# Patient Record
Sex: Male | Born: 1955
Health system: Southern US, Community
[De-identification: ages and names within clinical notes are randomized; demographics above are authoritative.]

## PROBLEM LIST (undated history)

## (undated) DIAGNOSIS — I10 Essential (primary) hypertension: Secondary | ICD-10-CM

## (undated) DIAGNOSIS — E785 Hyperlipidemia, unspecified: Secondary | ICD-10-CM

## (undated) DIAGNOSIS — K219 Gastro-esophageal reflux disease without esophagitis: Secondary | ICD-10-CM

## (undated) DIAGNOSIS — Z972 Presence of dental prosthetic device (complete) (partial): Secondary | ICD-10-CM

## (undated) DIAGNOSIS — K589 Irritable bowel syndrome without diarrhea: Secondary | ICD-10-CM

## (undated) HISTORY — DX: Gastro-esophageal reflux disease without esophagitis: K21.9

## (undated) HISTORY — PX: WISDOM TOOTH EXTRACTION: SHX21

## (undated) HISTORY — DX: Essential (primary) hypertension: I10

## (undated) HISTORY — DX: Hyperlipidemia, unspecified: E78.5

## (undated) HISTORY — PX: TONSILLECTOMY: SUR1361

## (undated) HISTORY — PX: VASECTOMY: SHX75

## (undated) HISTORY — DX: Irritable bowel syndrome, unspecified: K58.9

---

## 2007-09-14 ENCOUNTER — Ambulatory Visit: Payer: Self-pay | Admitting: Gastroenterology

## 2007-11-16 ENCOUNTER — Emergency Department: Payer: Self-pay | Admitting: Internal Medicine

## 2008-08-07 ENCOUNTER — Encounter: Payer: Self-pay | Admitting: Cardiovascular Disease

## 2008-12-26 ENCOUNTER — Encounter: Payer: Self-pay | Admitting: Cardiovascular Disease

## 2008-12-26 LAB — CONVERTED CEMR LAB
ALT: 33 units/L
AST: 31 units/L
Albumin: 4.4 g/dL
Alkaline Phosphatase: 43 units/L
BUN: 12 mg/dL
Basophils Relative: 0 %
CO2: 23 meq/L
Calcium: 8.9 mg/dL
Chloride: 101 meq/L
Cholesterol: 230 mg/dL
Cholesterol: 230 mg/dL
Creatinine, Ser: 0.95 mg/dL
Eosinophils Relative: 0.1 %
Glucose, Bld: 93 mg/dL
HCT: 41.1 %
HDL: 85 mg/dL
Hemoglobin: 14.1 g/dL
LDL (calc): 131 mg/dL
Lymphocytes, automated: 38 %
MCV: 100 fL
Monocytes Relative: 0.5 %
Neutrophils Relative %: 40 %
Platelets: 306 10*3/uL
Potassium: 4.7 meq/L
RBC: 4.12 M/uL
RDW: 14.1 %
Sodium: 141 meq/L
TSH: 1.58 microintl units/mL
Total Bilirubin: 0.7 mg/dL
Total Protein: 7.2 g/dL
Triglyceride fasting, serum: 72 mg/dL
WBC: 3.2 10*3/uL

## 2008-12-31 ENCOUNTER — Encounter: Payer: Self-pay | Admitting: Cardiovascular Disease

## 2009-11-11 ENCOUNTER — Telehealth: Payer: Self-pay | Admitting: Cardiovascular Disease

## 2009-12-26 ENCOUNTER — Encounter: Payer: Self-pay | Admitting: Cardiovascular Disease

## 2010-01-06 ENCOUNTER — Ambulatory Visit: Payer: Self-pay | Admitting: Cardiovascular Disease

## 2010-01-06 DIAGNOSIS — E785 Hyperlipidemia, unspecified: Secondary | ICD-10-CM | POA: Insufficient documentation

## 2010-01-06 DIAGNOSIS — I10 Essential (primary) hypertension: Secondary | ICD-10-CM | POA: Insufficient documentation

## 2010-01-07 ENCOUNTER — Encounter: Payer: Self-pay | Admitting: Cardiovascular Disease

## 2010-10-01 ENCOUNTER — Telehealth: Payer: Self-pay | Admitting: Cardiovascular Disease

## 2010-10-09 NOTE — Letter (Signed)
Summary: [facility]  [facility]   Imported By: Harlon Flor 01/15/2010 15:12:16  _____________________________________________________________________  External Attachment:    Type:   Image     Comment:   External Document

## 2010-10-09 NOTE — Letter (Signed)
Summary: [facility]  [facility]   Imported By: Harlon Flor 01/15/2010 15:12:54  _____________________________________________________________________  External Attachment:    Type:   Image     Comment:   External Document

## 2010-10-09 NOTE — Letter (Signed)
Summary: PHI  PHI   Imported By: Harlon Flor 01/13/2010 15:45:36  _____________________________________________________________________  External Attachment:    Type:   Image     Comment:   External Document

## 2010-10-09 NOTE — Progress Notes (Signed)
Summary: RX  Phone Note Refill Request Call back at Home Phone [phone] Message from:  Patient on October 01, 2010 10:24 AM  Refills Requested: Medication #1:  LOSARTAN POTASSIUM-HCTZ 100-25 MG TABS Take 1 tablet by mouth once a day [facility] in [place]  Initial call taken by: Harlon Flor,  October 01, 2010 10:24 AM    Prescriptions: LOSARTAN POTASSIUM-HCTZ 100-25 MG TABS (LOSARTAN POTASSIUM-HCTZ) Take 1 tablet by mouth once a day  #30 x 6   Entered by:   Lysbeth Galas CMA   Authorized by:   Dossie Arbour MD   Signed by:   Lysbeth Galas CMA on 10/01/2010   Method used:   Electronically to        [facility]. [phone]* (retail)       [address]       [place], [place]  [address]       Ph: [phone]       Fax: [phone]   RxID:   [id number]

## 2010-10-09 NOTE — Letter (Signed)
Summary: Medical Record Release  Medical Record Release   Imported By: Harlon Flor 01/07/2010 11:17:02  _____________________________________________________________________  External Attachment:    Type:   Image     Comment:   External Document

## 2010-10-09 NOTE — Progress Notes (Signed)
Summary: Refill  Phone Note Refill Request Call back at Home Phone [phone] Call back at [phone] Message from:  Patient on November 11, 2009 9:36 AM  Patient is former Engineer, water patient and would like to transfer care here to follow Dr. Mariah Milling.  He is out of amilodipine and would like for it to be called into [facility]  Initial call taken by: [place],  November 11, 2009 9:37 AM Caller: Patient Call For: Dr. Mariah Milling    New/Updated Medications: AMLODIPINE BESYLATE 5 MG TABS (AMLODIPINE BESYLATE) Take one tablet by mouth daily Prescriptions: AMLODIPINE BESYLATE 5 MG TABS (AMLODIPINE BESYLATE) Take one tablet by mouth daily  #30 x 3   Entered by:   Charlena Cross, RN, BSN   Authorized by:   Dossie Arbour MD   Signed by:   Charlena Cross, RN, BSN on 11/11/2009   Method used:   Electronically to        [facility]. [phone]* (retail)       [address]       [place], [place]  [address]       Ph: [phone]       Fax: [phone]   RxID:   [id number]

## 2010-10-09 NOTE — Assessment & Plan Note (Signed)
Summary: NP6/AMD   CC:  ROV; No Complaints.  History of Present Illness: Mr. Borthwick is a 55 year old gentleman with past medical history of hypertension, hyperlipidemia, irritable bowel syndrome and prostate issues presents for routine followup. He also has a history of palpitations. he is known to me from [facility], last seen April 2010.  He states that overall he has been doing well. He continues to work out, works in Research officer, political party. His blood pressure is typically been well-controlled though he is interested in changing his medications to a more simpler regimen 2. He denies any chest pain, shortness of breath, lightheadedness. He did cut his Diovan HCT in  half as he states that his pressure was a little bit low.  cholesterol from April 2010 showed total cholesterol 2:30, LDL 131, HDL 85.  He is no significant family history of coronary artery disease. In the past we did give him some samples of bystolic 5 mg for palpitations he states that this is not an issue.  Problems Prior to Update: None  Medications Prior to Update: 1)  Amlodipine Besylate 5 Mg Tabs (Amlodipine Besylate) .... Take One Tablet By Mouth Daily  Current Medications (verified): 1)  Amlodipine Besylate 5 Mg Tabs (Amlodipine Besylate) .... Take One Tablet By Mouth Daily 2)  Diovan Hct 160-12.5 Mg Tabs (Valsartan-Hydrochlorothiazide) .... Take Half  By Mouth Once Daily 3)  Fish Oil 1000 Mg Caps (Omega-3 Fatty Acids) .... Take 3 By Mouth Once Daily 4)  Red Yeast Rice 1200 Mg Caps (Red Yeast Rice Extract) .... Take 2 By Mouth Once Daily 5)  Avodart 0.5 Mg Caps (Dutasteride) .... Take 1 By Mouth Once Daily 6)  Prostavar .... Take 2 By Mouth Once Daily 7)  Vitamin B-12 500 Mcg Subl (Cyanocobalamin) .... Once Daily  Allergies (verified): No Known Drug Allergies  Review of Systems  The patient denies fever, vision loss, decreased hearing, hoarseness, chest pain, syncope, dyspnea on exertion,  peripheral edema, prolonged cough, abdominal pain, incontinence, muscle weakness, depression, and enlarged lymph nodes.    Vital Signs:  Patient profile:   55 year old male Height:      72204 inches Weight:      12 pounds BMI:     0.00 Pulse rate:   59 / minute BP sitting:   120 / 80  (left arm) Cuff size:   regular  Vitals Entered By: Stanton Kidney, EMT-P (Jan 06, 2010 10:03 AM)  Physical Exam  General:  well-appearing middle-aged gentleman in no apparent distress, HEENT exam is benign, oropharynx is clear, neck is supple with no JVP or carotid bruits, heart sounds are regular with S1-S2 and no murmurs appreciated, lungs are clear to auscultation with no wheezes Rales, abdominal exam is benign, no significant lower extremity edema, pulses are equal and symmetrical in his upper and lower extremity, neurological exam is grossly nonfocal, skin is warm and dry.    EKG  Procedure date:  01/06/2010  Findings:      normal sinus rhythm with rate 59 beats per minute, no significant ST or T wave changes.  Impression & Recommendations:  Problem # 1:  HYPERLIPIDEMIA-MIXED (ICD-272.4) history of hyperlipidemia. Cholesterol Number is as previously detailed from April 2010. We will check his cholesterol in the next week or so. He takes over-the-counter or herbal supplements. No strong family history of coronary artery disease, father in his 56s when he died from cancer.  Problem # 2:  HYPERTENSION, BENIGN (ICD-401.1) Blood pressure is  well controlled. we'll change him to losartan HCT 100/25 mg daily taken started on one half dose per day and check his pressure as an outpatient. He can hold his amlodipine and hold his Diovan HCT.  His updated medication list for this problem includes:    Amlodipine Besylate 5 Mg Tabs (Amlodipine besylate) ..... Hold    Diovan Hct 160-12.5 Mg Tabs (Valsartan-hydrochlorothiazide) ..... Hold    Losartan Potassium-hctz 100-25 Mg Tabs (Losartan potassium-hctz) .Marland Kitchen...  Take 1 tablet by mouth once a day  Patient Instructions: 1)  Your physician recommends that you schedule a follow-up appointment in: 1 year with Dr Mariah Milling.   2)  Your physician recommends that you return for a FASTING lipid profile: at your convience (Lipid/LFT) 3)  Your physician has recommended you make the following change in your medication: Hold your Amlodipine and your Diovan.  Start taking Losartan/ HCT 100/25mg  daily, #30 tablets has been sent to the [facility] in [place]. Prescriptions: LOSARTAN POTASSIUM-HCTZ 100-25 MG TABS (LOSARTAN POTASSIUM-HCTZ) Take 1 tablet by mouth once a day  #30 x 6   Entered by:   Cloyde Reams RN   Authorized by:   Dossie Arbour MD   Signed by:   Cloyde Reams RN on 01/06/2010   Method used:   Electronically to        [facility]. [phone]* (retail)       [address]       [place], [place]  [address]       Ph: [phone]       Fax: [phone]   RxID:   [id number]

## 2011-01-29 ENCOUNTER — Telehealth: Payer: Self-pay | Admitting: Cardiovascular Disease

## 2011-01-29 NOTE — Telephone Encounter (Signed)
Left message with relative for the pt to call and schedule a f/u appt with Gollan.

## 2011-03-17 ENCOUNTER — Ambulatory Visit: Payer: Self-pay | Admitting: Gastroenterology

## 2011-03-24 ENCOUNTER — Encounter: Payer: Self-pay | Admitting: Cardiovascular Disease

## 2011-10-07 ENCOUNTER — Telehealth: Payer: Self-pay

## 2011-10-07 MED ORDER — LOSARTAN POTASSIUM-HCTZ 100-25 MG PO TABS: 1.0000 | ORAL_TABLET | Freq: Every day | ORAL | Status: DC

## 2011-10-07 NOTE — Telephone Encounter (Signed)
Refill for losartan hctz 100-25 mg take one tablet daily.

## 2011-10-26 ENCOUNTER — Ambulatory Visit: Payer: Self-pay | Admitting: Cardiovascular Disease

## 2011-11-25 ENCOUNTER — Ambulatory Visit: Payer: Self-pay | Admitting: Cardiovascular Disease

## 2011-12-22 ENCOUNTER — Encounter: Payer: Self-pay | Admitting: Cardiovascular Disease

## 2011-12-22 ENCOUNTER — Ambulatory Visit (INDEPENDENT_AMBULATORY_CARE_PROVIDER_SITE_OTHER): Payer: BC Managed Care – PPO | Admitting: Cardiovascular Disease

## 2011-12-22 VITALS — BP 159/92 | HR 78 | Ht 72.0 in | Wt 209.8 lb

## 2011-12-22 DIAGNOSIS — E785 Hyperlipidemia, unspecified: Secondary | ICD-10-CM

## 2011-12-22 DIAGNOSIS — I10 Essential (primary) hypertension: Secondary | ICD-10-CM

## 2011-12-22 MED ORDER — LOSARTAN POTASSIUM-HCTZ 100-25 MG PO TABS: 1.0000 | ORAL_TABLET | Freq: Every day | ORAL | Status: DC

## 2011-12-22 MED ORDER — AMLODIPINE-VALSARTAN-HCTZ 10-320-25 MG PO TABS: 1.0000 | ORAL_TABLET | Freq: Every day | ORAL | Status: DC

## 2011-12-22 NOTE — Progress Notes (Signed)
Patient ID: Christopher Cervantes, male    DOB: 07/07/1956, 56 y.o.   MRN: [id number]  HPI Comments: Mr. Mehra is a 56 year old gentleman with past medical history of hypertension, hyperlipidemia, irritable bowel syndrome and prostate issues, a history of palpitations, who presents for routine followup.   He states that overall he has been doing well. His weight has been climbing . He continues to work out, works in Research officer, political party.  Blood pressure has been high recently but he does not check it often. He denies any chest pain, shortness of breath, lightheadedness.   cholesterol from April 2010 showed total cholesterol 2:30, LDL 131, HDL 85.   He is no significant family history of coronary artery disease.  EKG shows normal sinus rhythm with rate 58 beats per minute, no significant ST or T wave changes      Outpatient Encounter Prescriptions as of 12/22/2011  Medication Sig Dispense Refill  . CALCIUM-VITAMIN D PO Take 600 mg by mouth daily.      Marland Kitchen Co-Enzyme Q-10 100 MG CAPS Take 1 capsule by mouth daily. 1000 mg?       . Cyanocobalamin (VITAMIN B-12) 1000 MCG SUBL Place 1 tablet under the tongue as needed.       Marland Kitchen dexlansoprazole (DEXILANT) 60 MG capsule Take 60 mg by mouth daily.      Marland Kitchen dutasteride (AVODART) 0.5 MG capsule Take 0.5 mg by mouth daily.        . Flaxseed, Linseed, 1000 MG CAPS Take 3 capsules by mouth daily.       Marland Kitchen losartan-hydrochlorothiazide (HYZAAR) 100-25 MG per tablet Take 1 tablet by mouth daily.  90 tablet  3  . Red Yeast Rice 600 MG CAPS Take 2 capsules by mouth every other day.        . vitamin B-12 (CYANOCOBALAMIN) 100 MCG tablet Take 50 mcg by mouth as needed.          Review of Systems  Constitutional: Negative.   HENT: Negative.   Eyes: Negative.   Respiratory: Negative.   Cardiovascular: Negative.   Gastrointestinal: Negative.   Musculoskeletal: Negative.   Skin: Negative.   Neurological: Negative.   Hematological: Negative.   Psychiatric/Behavioral:  Negative.   All other systems reviewed and are negative.    BP 159/92  Pulse 78  Ht 6' (1.829 m)  Wt 209 lb 12.8 oz (95.165 kg)  BMI 28.45 kg/m2  Physical Exam  Nursing note and vitals reviewed. Constitutional: He is oriented to person, place, and time. He appears well-developed and well-nourished.  HENT:  Head: Normocephalic.  Nose: Nose normal.  Mouth/Throat: Oropharynx is clear and moist.  Eyes: Conjunctivae are normal. Pupils are equal, round, and reactive to light.  Neck: Normal range of motion. Neck supple. No JVD present.  Cardiovascular: Normal rate, regular rhythm, S1 normal, S2 normal, normal heart sounds and intact distal pulses.  Exam reveals no gallop and no friction rub.   No murmur heard. Pulmonary/Chest: Effort normal and breath sounds normal. No respiratory distress. He has no wheezes. He has no rales. He exhibits no tenderness.  Abdominal: Soft. Bowel sounds are normal. He exhibits no distension. There is no tenderness.  Musculoskeletal: Normal range of motion. He exhibits no edema and no tenderness.  Lymphadenopathy:    He has no cervical adenopathy.  Neurological: He is alert and oriented to person, place, and time. Coordination normal.  Skin: Skin is warm and dry. No rash noted. No erythema.  Psychiatric: He has a  normal mood and affect. His behavior is normal. Judgment and thought content normal.           Assessment and Plan

## 2011-12-22 NOTE — Patient Instructions (Signed)
You are doing well. Please start exforge HCT one pill daily  We will look for your cholesterol when done  Please call us if you have new issues that need to be addressed before your next appt.  Your physician wants you to follow-up in: 6 months.  You will receive a reminder letter in the mail two months in advance. If you don't receive a letter, please call our office to schedule the follow-up appointment.

## 2011-12-22 NOTE — Assessment & Plan Note (Signed)
Blood pressure is elevated likely secondary to weight gain of more than 10 pounds. We will change his losartan HCTZ to exforge HCT 10/320/25. We have given him a coupon.

## 2011-12-22 NOTE — Assessment & Plan Note (Signed)
We will check his lipids at his convenience.

## 2012-09-05 DIAGNOSIS — R972 Elevated prostate specific antigen [PSA]: Secondary | ICD-10-CM | POA: Insufficient documentation

## 2012-09-05 DIAGNOSIS — N529 Male erectile dysfunction, unspecified: Secondary | ICD-10-CM | POA: Insufficient documentation

## 2012-09-05 DIAGNOSIS — D075 Carcinoma in situ of prostate: Secondary | ICD-10-CM | POA: Insufficient documentation

## 2012-09-05 DIAGNOSIS — N411 Chronic prostatitis: Secondary | ICD-10-CM | POA: Insufficient documentation

## 2012-09-05 DIAGNOSIS — N138 Other obstructive and reflux uropathy: Secondary | ICD-10-CM | POA: Insufficient documentation

## 2013-01-12 ENCOUNTER — Other Ambulatory Visit: Payer: Self-pay | Admitting: Cardiovascular Disease

## 2013-01-13 ENCOUNTER — Telehealth: Payer: Self-pay

## 2013-01-13 MED ORDER — AMLODIPINE-VALSARTAN-HCTZ 10-320-25 MG PO TABS: 1.0000 | ORAL_TABLET | Freq: Every day | ORAL | Status: DC

## 2013-01-13 NOTE — Telephone Encounter (Signed)
Pt overdue for 6 month f/u last seen 12/2011 its been over a yr. Scheduled future appoint. 02/10/13. Sent in refilll for exforge 10-320-25 to [facility].

## 2013-01-13 NOTE — Telephone Encounter (Signed)
Pt states he needs exforge refill, [facility] in [place]. Please call

## 2013-02-10 ENCOUNTER — Ambulatory Visit: Payer: BC Managed Care – PPO | Admitting: Cardiovascular Disease

## 2013-03-02 ENCOUNTER — Ambulatory Visit: Payer: BC Managed Care – PPO | Admitting: Cardiovascular Disease

## 2013-03-13 ENCOUNTER — Ambulatory Visit (INDEPENDENT_AMBULATORY_CARE_PROVIDER_SITE_OTHER): Payer: BC Managed Care – PPO | Admitting: Cardiovascular Disease

## 2013-03-13 ENCOUNTER — Encounter: Payer: Self-pay | Admitting: Cardiovascular Disease

## 2013-03-13 VITALS — BP 110/80 | HR 92 | Ht 72.0 in | Wt 203.5 lb

## 2013-03-13 DIAGNOSIS — E785 Hyperlipidemia, unspecified: Secondary | ICD-10-CM

## 2013-03-13 DIAGNOSIS — I10 Essential (primary) hypertension: Secondary | ICD-10-CM

## 2013-03-13 MED ORDER — AMLODIPINE BESYLATE-VALSARTAN 10-320 MG PO TABS: 1.0000 | ORAL_TABLET | Freq: Every day | ORAL | Status: DC

## 2013-03-13 NOTE — Assessment & Plan Note (Signed)
For his erectile dysfunction, we will change him to exforge 10/320 mg daily. We will stop the HCTZ. We have suggested he closely monitor his blood pressure.

## 2013-03-13 NOTE — Progress Notes (Signed)
Patient ID: Christopher Cervantes, male    DOB: [birth year], 57 y.o.   MRN: [id number]  HPI Comments: Christopher Cervantes is a 57year-old gentleman with past medical history of hypertension, hyperlipidemia, irritable bowel syndrome and prostate issues, a history of palpitations, who presents for routine followup.   He states that overall he has been doing well.  He continues to work out, works in Research officer, political party. He plays tennis frequently with no symptoms of chest pain or shortness of breath.   Blood pressure has been well-controlled but does report possible erectile dysfunction issues on exforge HCT He is no significant family history of coronary artery disease.  EKG shows normal sinus rhythm with rate 92 beats per minute, no significant ST or T wave changes  Heart rate elevated as he was rushing to get to the office    Outpatient Encounter Prescriptions as of 03/13/2013  Medication Sig Dispense Refill  . ALPRAZolam (XANAX) 0.5 MG tablet Take 0.25 mg by mouth at bedtime as needed.       Marland Kitchen amLODipine-valsartan (EXFORGE) 10-320 MG per tablet Take 1 tablet by mouth daily.  30 tablet  11  . CALCIUM-VITAMIN D PO Take 600 mg by mouth daily.      Marland Kitchen Co-Enzyme Q-10 100 MG CAPS Take 1 capsule by mouth daily. 1000 mg?       . Cyanocobalamin (VITAMIN B-12) 1000 MCG SUBL Place 1 tablet under the tongue as needed.       Marland Kitchen dexlansoprazole (DEXILANT) 60 MG capsule Take 60 mg by mouth daily.      . finasteride (PROSCAR) 5 MG tablet Take 5 mg by mouth daily.       . Flaxseed Oil OIL 2,400 mg by Does not apply route daily.      . Probiotic Product (SOLUBLE FIBER/PROBIOTICS PO) Take by mouth daily.      . Red Yeast Rice 600 MG CAPS Take 2 capsules by mouth every other day.          Review of Systems  Constitutional: Negative.   HENT: Negative.   Eyes: Negative.   Respiratory: Negative.   Cardiovascular: Negative.   Gastrointestinal: Negative.   Musculoskeletal: Negative.   Skin: Negative.   Neurological: Negative.    Psychiatric/Behavioral: Negative.   All other systems reviewed and are negative.    BP 110/80  Pulse 92  Ht 6' (1.829 m)  Wt 203 lb 8 oz (92.307 kg)  BMI 27.59 kg/m2  Physical Exam  Nursing note and vitals reviewed. Constitutional: He is oriented to person, place, and time. He appears well-developed and well-nourished.  HENT:  Head: Normocephalic.  Nose: Nose normal.  Mouth/Throat: Oropharynx is clear and moist.  Eyes: Conjunctivae are normal. Pupils are equal, round, and reactive to light.  Neck: Normal range of motion. Neck supple. No JVD present.  Cardiovascular: Normal rate, regular rhythm, S1 normal, S2 normal, normal heart sounds and intact distal pulses.  Exam reveals no gallop and no friction rub.   No murmur heard. Pulmonary/Chest: Effort normal and breath sounds normal. No respiratory distress. He has no wheezes. He has no rales. He exhibits no tenderness.  Abdominal: Soft. Bowel sounds are normal. He exhibits no distension. There is no tenderness.  Musculoskeletal: Normal range of motion. He exhibits no edema and no tenderness.  Lymphadenopathy:    He has no cervical adenopathy.  Neurological: He is alert and oriented to person, place, and time. Coordination normal.  Skin: Skin is warm and dry. No rash noted.  No erythema.  Psychiatric: He has a normal mood and affect. His behavior is normal. Judgment and thought content normal.      Assessment and Plan

## 2013-03-13 NOTE — Assessment & Plan Note (Signed)
We have ordered repeat lipids and LFTs.

## 2013-03-13 NOTE — Patient Instructions (Addendum)
You are doing well. Please change the exforge HCT to exforge 320/10 mg  We have ordered routine blood work, fasting to be done in the the next few weeks.  Please call us if you have new issues that need to be addressed before your next appt.  Your physician wants you to follow-up in: 12 months.  You will receive a reminder letter in the mail two months in advance. If you don't receive a letter, please call our office to schedule the follow-up appointment.

## 2013-03-15 ENCOUNTER — Other Ambulatory Visit: Payer: Self-pay

## 2013-03-15 MED ORDER — AMLODIPINE BESYLATE-VALSARTAN 10-320 MG PO TABS: 1.0000 | ORAL_TABLET | Freq: Every day | ORAL | Status: DC

## 2014-05-02 ENCOUNTER — Other Ambulatory Visit: Payer: Self-pay | Admitting: Cardiovascular Disease

## 2014-05-02 ENCOUNTER — Other Ambulatory Visit: Payer: Self-pay

## 2014-05-04 ENCOUNTER — Other Ambulatory Visit: Payer: Self-pay

## 2014-05-04 MED ORDER — AMLODIPINE BESYLATE-VALSARTAN 10-320 MG PO TABS: 1.0000 | ORAL_TABLET | Freq: Every day | ORAL | Status: DC

## 2014-05-07 ENCOUNTER — Other Ambulatory Visit: Payer: Self-pay

## 2014-05-07 MED ORDER — AMLODIPINE BESYLATE-VALSARTAN 10-320 MG PO TABS: 1.0000 | ORAL_TABLET | Freq: Every day | ORAL | Status: DC

## 2014-05-07 NOTE — Telephone Encounter (Signed)
Refill sent for exforge 10/320 mg

## 2014-05-28 ENCOUNTER — Ambulatory Visit: Payer: BC Managed Care – PPO | Admitting: Cardiovascular Disease

## 2014-06-05 ENCOUNTER — Ambulatory Visit: Payer: BC Managed Care – PPO | Admitting: Cardiovascular Disease

## 2014-06-20 ENCOUNTER — Encounter: Payer: Self-pay | Admitting: Cardiovascular Disease

## 2014-06-20 ENCOUNTER — Ambulatory Visit (INDEPENDENT_AMBULATORY_CARE_PROVIDER_SITE_OTHER): Payer: BC Managed Care – PPO | Admitting: Cardiovascular Disease

## 2014-06-20 VITALS — BP 130/82 | HR 84 | Ht 72.0 in | Wt 211.2 lb

## 2014-06-20 DIAGNOSIS — E785 Hyperlipidemia, unspecified: Secondary | ICD-10-CM

## 2014-06-20 DIAGNOSIS — I10 Essential (primary) hypertension: Secondary | ICD-10-CM

## 2014-06-20 NOTE — Patient Instructions (Addendum)
Your next appointment will be scheduled in our new office located at :  [place]  [address] [place], [place] [address]  You are doing well. No medication changes were made.  Come in at your convenience for lab work, fasting  Please call us if you have new issues that need to be addressed before your next appt.  Your physician wants you to follow-up in: 12 months.  You will receive a reminder letter in the mail two months in advance. If you don't receive a letter, please call our office to schedule the follow-up appointment.

## 2014-06-20 NOTE — Assessment & Plan Note (Signed)
Blood pressure is well controlled on today's visit. No changes made to the medications. 

## 2014-06-20 NOTE — Progress Notes (Signed)
Patient ID: Christopher Cervantes, male    DOB: 1956/07/14, 58 y.o.   MRN: [id number]  HPI Comments: Christopher Cervantes is a 58 year-old gentleman with past medical history of hypertension, hyperlipidemia, irritable bowel syndrome and prostate issues, a history of palpitations, who presents for routine followup.   He states that overall he has been doing well.  He continues to work out, works in Personal assistant.   plays tennis with no symptoms of chest pain or shortness of breath.  He reports that he goes to the gym 7 days per week On his last clinic visit, HCTZ was held Now he takes one half pill of his exforeg daily and blood pressure is well controlled   no significant family history of coronary artery disease.  EKG shows normal sinus rhythm with rate 84 beats per minute, no significant ST or T wave changes    Outpatient Encounter Prescriptions as of 06/20/2014  Medication Sig  . ALPRAZolam (XANAX) 0.5 MG tablet Take 0.25 mg by mouth at bedtime as needed.   Marland Kitchen amLODipine-valsartan (EXFORGE) 10-320 MG per tablet Take 1 tablet by mouth daily.  Marland Kitchen CALCIUM-VITAMIN D PO Take 600 mg by mouth daily.  Marland Kitchen Co-Enzyme Q-10 100 MG CAPS Take 1 capsule by mouth daily. 1000 mg?   . Cyanocobalamin (VITAMIN B-12) 1000 MCG SUBL Place 1 tablet under the tongue as needed.   Marland Kitchen dexlansoprazole (DEXILANT) 60 MG capsule Take 60 mg by mouth daily.  . finasteride (PROSCAR) 5 MG tablet Take 5 mg by mouth daily.   . Flaxseed Oil OIL 2,400 mg by Does not apply route daily.  . Probiotic Product (SOLUBLE FIBER/PROBIOTICS PO) Take by mouth daily.  . Red Yeast Rice 600 MG CAPS Take 2 capsules by mouth every other day.      Review of Systems  Constitutional: Negative.   HENT: Negative.   Eyes: Negative.   Respiratory: Negative.   Cardiovascular: Negative.   Gastrointestinal: Negative.   Endocrine: Negative.   Musculoskeletal: Negative.   Skin: Negative.   Allergic/Immunologic: Negative.   Neurological: Negative.    Hematological: Negative.   Psychiatric/Behavioral: Negative.   All other systems reviewed and are negative.   BP 130/82  Pulse 84  Ht 6' (1.829 m)  Wt 211 lb 4 oz (95.822 kg)  BMI 28.64 kg/m2  Physical Exam  Nursing note and vitals reviewed. Constitutional: He is oriented to person, place, and time. He appears well-developed and well-nourished.  HENT:  Head: Normocephalic.  Nose: Nose normal.  Mouth/Throat: Oropharynx is clear and moist.  Eyes: Conjunctivae are normal. Pupils are equal, round, and reactive to light.  Neck: Normal range of motion. Neck supple. No JVD present.  Cardiovascular: Normal rate, regular rhythm, S1 normal, S2 normal, normal heart sounds and intact distal pulses.  Exam reveals no gallop and no friction rub.   No murmur heard. Pulmonary/Chest: Effort normal and breath sounds normal. No respiratory distress. He has no wheezes. He has no rales. He exhibits no tenderness.  Abdominal: Soft. Bowel sounds are normal. He exhibits no distension. There is no tenderness.  Musculoskeletal: Normal range of motion. He exhibits no edema and no tenderness.  Lymphadenopathy:    He has no cervical adenopathy.  Neurological: He is alert and oriented to person, place, and time. Coordination normal.  Skin: Skin is warm and dry. No rash noted. No erythema.  Psychiatric: He has a normal mood and affect. His behavior is normal. Judgment and thought content normal.  Assessment and Plan

## 2014-06-20 NOTE — Assessment & Plan Note (Signed)
We have suggested that he come for fasting blood work at his convenience. Order has been placed

## 2015-03-19 ENCOUNTER — Ambulatory Visit (INDEPENDENT_AMBULATORY_CARE_PROVIDER_SITE_OTHER): Payer: No Typology Code available for payment source | Admitting: Family Medicine

## 2015-03-19 ENCOUNTER — Encounter: Payer: Self-pay | Admitting: Family Medicine

## 2015-03-19 VITALS — BP 142/82 | HR 66 | Temp 99.2°F | Resp 16 | Ht 72.0 in | Wt 216.4 lb

## 2015-03-19 DIAGNOSIS — L309 Dermatitis, unspecified: Secondary | ICD-10-CM | POA: Diagnosis not present

## 2015-03-19 MED ORDER — NYSTATIN 100000 UNIT/GM EX CREA: 1.0000 "application " | TOPICAL_CREAM | Freq: Two times a day (BID) | CUTANEOUS | Status: DC

## 2015-03-19 NOTE — Patient Instructions (Signed)
Call for dermatology referral if not improving in two weeks.

## 2015-03-19 NOTE — Progress Notes (Signed)
Subjective:     Patient ID: Christopher Cervantes, male   DOB: 1956-03-09, 59 y.o.   MRN: [id number]  HPI  Chief Complaint  Patient presents with  . Rash    patient comes in office today with concerns of rash near pubic area, he describes skin as being dry anv very itchy. Patient is in a monogamous relationship, his spouse does not show signs or symptoms of rash  States he has been putting Gold Bond on this and it has improved. Reports hx of dry skin but no psoriasis or STD.   Review of Systems  Constitutional: Negative for fever and chills.       Objective:   Physical Exam  Constitutional: He appears well-developed and well-nourished. No distress.  Skin:  Two area of slightly pigmented plaque on his glans penis. Minimal scaling noted/not friable. No ulcers, vesicles, or drainage.       Assessment:    1. Eczema - nystatin cream (MYCOSTATIN); Apply 1 application topically 2 (two) times daily. Mix with hydrocortisone cream in equal amounts  Dispense: 30 g; Refill: 0    Plan:    Will refer to dermatology if not improved in two weeks.

## 2015-04-04 ENCOUNTER — Other Ambulatory Visit: Payer: Self-pay | Admitting: *Deleted

## 2015-04-04 MED ORDER — AMLODIPINE BESYLATE-VALSARTAN 10-320 MG PO TABS: 1.0000 | ORAL_TABLET | Freq: Every day | ORAL | Status: DC

## 2015-09-17 ENCOUNTER — Ambulatory Visit: Payer: Self-pay | Admitting: Nurse Practitioner

## 2015-11-12 ENCOUNTER — Encounter (INDEPENDENT_AMBULATORY_CARE_PROVIDER_SITE_OTHER): Payer: Self-pay

## 2015-11-12 ENCOUNTER — Encounter: Payer: Self-pay | Admitting: Cardiovascular Disease

## 2015-11-12 ENCOUNTER — Ambulatory Visit (INDEPENDENT_AMBULATORY_CARE_PROVIDER_SITE_OTHER): Payer: BLUE CROSS/BLUE SHIELD | Admitting: Cardiovascular Disease

## 2015-11-12 VITALS — BP 126/84 | HR 78 | Ht 72.0 in | Wt 203.5 lb

## 2015-11-12 DIAGNOSIS — E785 Hyperlipidemia, unspecified: Secondary | ICD-10-CM

## 2015-11-12 DIAGNOSIS — F329 Major depressive disorder, single episode, unspecified: Secondary | ICD-10-CM

## 2015-11-12 DIAGNOSIS — F418 Other specified anxiety disorders: Secondary | ICD-10-CM | POA: Diagnosis not present

## 2015-11-12 DIAGNOSIS — I10 Essential (primary) hypertension: Secondary | ICD-10-CM

## 2015-11-12 DIAGNOSIS — F419 Anxiety disorder, unspecified: Secondary | ICD-10-CM | POA: Insufficient documentation

## 2015-11-12 DIAGNOSIS — F32A Depression, unspecified: Secondary | ICD-10-CM

## 2015-11-12 MED ORDER — AMLODIPINE BESYLATE-VALSARTAN 10-320 MG PO TABS: 1.0000 | ORAL_TABLET | Freq: Every day | ORAL | Status: DC

## 2015-11-12 NOTE — Assessment & Plan Note (Signed)
Blood pressure is well controlled on today's visit. No changes made to the medications. 

## 2015-11-12 NOTE — Patient Instructions (Addendum)
You are doing well. No medication changes were made.  We will order liver and lipids (labs) at your convenience  Please call us if you have new issues that need to be addressed before your next appt.  Your physician wants you to follow-up in: 12 months.  You will receive a reminder letter in the mail two months in advance. If you don't receive a letter, please call our office to schedule the follow-up appointment.

## 2015-11-12 NOTE — Progress Notes (Signed)
Patient ID: LABON FONT, male    DOB: 02-Jun-1956, 60 y.o.   MRN: [id number]  HPI Comments: Mr. Pruette is a 60 year-old gentleman with past medical history of hypertension, hyperlipidemia, irritable bowel syndrome and prostate issues, a history of palpitations, who presents for routine followup  Of his hypertension and cholesterol    He continues to work out, works in Personal assistant.  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 Are not paying the rent This has caused insomnia, panic attack like symptoms one family member is a Engineer, water and recommends he start SSRI He has not talked with primary care  He is taking one half blood pressure pill amlodipine valsartan  Reports excellent control of his blood pressure  EKG on today's visit shows normal sinus rhythm with rate 78 bpm, no significant ST or T-wave changes  Other past medical history  no significant family history of coronary artery disease.    No Known Allergies  Current Outpatient Prescriptions on File Prior to Visit  Medication Sig Dispense Refill  . ALPRAZolam (XANAX) 0.5 MG tablet Take 0.25 mg by mouth at bedtime as needed.     Marland Kitchen CALCIUM-VITAMIN D PO Take 600 mg by mouth daily.    Marland Kitchen Co-Enzyme Q-10 100 MG CAPS Take 1 capsule by mouth daily. 1000 mg?     . Cyanocobalamin (VITAMIN B-12) 1000 MCG SUBL Place 1 tablet under the tongue as needed.     . finasteride (PROSCAR) 5 MG tablet Take 5 mg by mouth as needed.     . Flaxseed Oil OIL 2,400 mg by Does not apply route daily.    . halobetasol (ULTRAVATE) 0.05 % ointment apply to rash twice a day as needed.  0  . nystatin cream (MYCOSTATIN) Apply 1 application topically 2 (two) times daily. Mix with hydrocortisone cream in equal amounts 30 g 0  . Probiotic Product (SOLUBLE FIBER/PROBIOTICS PO) Take by mouth daily.    . Red Yeast Rice 600 MG CAPS Take 2 capsules by mouth every other day.       No current facility-administered medications on file  prior to visit.    Past Medical History  Diagnosis Date  . HLD (hyperlipidemia)   . HTN (hypertension)   . IBS (irritable bowel syndrome)   . GERD (gastroesophageal reflux disease)     Past Surgical History  Procedure Laterality Date  . Vasectomy    . Tonsillectomy    . Wisdom tooth extraction      Social History  reports that he quit smoking about 33 years ago. He does not have any smokeless tobacco history on file. He reports that he drinks alcohol. He reports that he does not use illicit drugs.  Family History family history includes Cancer in his father; Hyperlipidemia in his brother, mother, and sister; Hypertension in his brother, mother, and sister.   Review of Systems  Constitutional: Negative.   Respiratory: Negative.   Cardiovascular: Negative.   Gastrointestinal: Negative.   Musculoskeletal: Negative.   Allergic/Immunologic: Negative.   Neurological: Negative.   Hematological: Negative.   Psychiatric/Behavioral: Positive for sleep disturbance. The patient is nervous/anxious.   All other systems reviewed and are negative.   BP 126/84 mmHg  Pulse 78  Ht 6' (1.829 m)  Wt 203 lb 8 oz (92.307 kg)  BMI 27.59 kg/m2  Physical Exam  Constitutional: He is oriented to person, place, and time. He appears well-developed and well-nourished.  HENT:  Head: Normocephalic.  Nose: Nose normal.  Mouth/Throat: Oropharynx is clear and moist.  Eyes: Conjunctivae are normal. Pupils are equal, round, and reactive to light.  Neck: Normal range of motion. Neck supple. No JVD present.  Cardiovascular: Normal rate, regular rhythm, S1 normal, S2 normal, normal heart sounds and intact distal pulses.  Exam reveals no gallop and no friction rub.   No murmur heard. Pulmonary/Chest: Effort normal and breath sounds normal. No respiratory distress. He has no wheezes. He has no rales. He exhibits no tenderness.  Abdominal: Soft. Bowel sounds are normal. He exhibits no distension. There is  no tenderness.  Musculoskeletal: Normal range of motion. He exhibits no edema or tenderness.  Lymphadenopathy:    He has no cervical adenopathy.  Neurological: He is alert and oriented to person, place, and time. Coordination normal.  Skin: Skin is warm and dry. No rash noted. No erythema.  Psychiatric: He has a normal mood and affect. His behavior is normal. Judgment and thought content normal.      Assessment and Plan   Nursing note and vitals reviewed.

## 2015-11-12 NOTE — Assessment & Plan Note (Signed)
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 Rosanna Randy.

## 2015-11-12 NOTE — Assessment & Plan Note (Signed)
Cholesterol is at goal on the current lipid regimen. No changes to the medications were made.  

## 2015-11-17 ENCOUNTER — Other Ambulatory Visit: Payer: Self-pay | Admitting: Cardiovascular Disease

## 2015-11-19 ENCOUNTER — Encounter: Payer: Self-pay | Admitting: Family Medicine

## 2015-11-19 ENCOUNTER — Ambulatory Visit (INDEPENDENT_AMBULATORY_CARE_PROVIDER_SITE_OTHER): Payer: BLUE CROSS/BLUE SHIELD | Admitting: Family Medicine

## 2015-11-19 VITALS — BP 130/88 | HR 77 | Temp 98.9°F | Resp 16 | Wt 203.4 lb

## 2015-11-19 DIAGNOSIS — J069 Acute upper respiratory infection, unspecified: Secondary | ICD-10-CM

## 2015-11-19 DIAGNOSIS — I10 Essential (primary) hypertension: Secondary | ICD-10-CM

## 2015-11-19 MED ORDER — HYDROCOD POLST-CPM POLST ER 10-8 MG/5ML PO SUER: 5.0000 mL | Freq: Two times a day (BID) | ORAL | Status: DC | PRN

## 2015-11-19 NOTE — Patient Instructions (Signed)
Discussed use of Mucinex D. 

## 2015-11-19 NOTE — Progress Notes (Signed)
Subjective:     Patient ID: Christopher Cervantes, male   DOB: 1956/04/15, 59 y.o.   MRN: [id number]  HPI  Chief Complaint  Patient presents with  . Cough    Patient comes in office today with concerns of productive cough since 11/15/15. Patient reports that he was seen at Urgent Care in [place] on 10/25/15 and diagnosed with Flu and prescribed Tamiflu. Patient states that he was also prescribed antibiotic but read online that it was not okay to take Tamiflu with antibiotic  so he did not start taking antibioitc ( Azithromycin 250mg ) until 4 days ago. Patient reports that he has been taking Guaifenesin-Codiene 100mg  with no relief, patient reports wheezing  States he recovered rapidly from the flu with Tamiflu. Current cough accompanied by sore throat and sinus congestion. Has been taking Coricidin and left over Tussionex for his sx.   Review of Systems     Objective:   Physical Exam  Constitutional: He appears well-developed and well-nourished. No distress.  Ears: T.M's intact without inflammation Throat: mild posterior pharyngeal erythema Neck: no cervical adenopathy Lungs: clear     Assessment:    1.Upper respiratory infection - chlorpheniramine-HYDROcodone (TUSSIONEX PENNKINETIC ER) 10-8 MG/5ML SUER; Take 5 mLs by mouth every 12 (twelve) hours as needed for cough.  Dispense: 120 mL    Plan:    Discussed use of Mucinex D

## 2015-11-27 ENCOUNTER — Encounter: Payer: Self-pay | Admitting: Family Medicine

## 2015-11-27 ENCOUNTER — Ambulatory Visit (INDEPENDENT_AMBULATORY_CARE_PROVIDER_SITE_OTHER): Payer: BLUE CROSS/BLUE SHIELD | Admitting: Family Medicine

## 2015-11-27 VITALS — BP 122/82 | HR 76 | Temp 98.9°F | Resp 16 | Wt 202.2 lb

## 2015-11-27 DIAGNOSIS — J069 Acute upper respiratory infection, unspecified: Secondary | ICD-10-CM

## 2015-11-27 NOTE — Patient Instructions (Signed)
Your lungs sound good. Your upper respiratory infection is improving as expected at one week out.

## 2015-11-27 NOTE — Progress Notes (Signed)
Subjective:     Patient ID: Christopher Cervantes, male   DOB: 05/07/1956, 60 y.o.   MRN: [id number]  HPI  Chief Complaint  Patient presents with  . Cough    Follow up visit from 11/19/15 patient was started on Chlorperniramine Hydrocodone and using mucinex D. Patient reports that cough has improved but cough is more apparant in the morning  upon awakening. Patient reports decreased appetite and inability to taste food.   States sinus congestion and sputum are clear. He has started to work out again.   Review of Systems     Objective:   Physical Exam  Constitutional: He appears well-developed and well-nourished. No distress.  Pulmonary/Chest: Breath sounds normal. He has no wheezes. He has no rales.       Assessment:    1. Upper respiratory infection: improving     Plan:    Continue symptomatic treatment.

## 2016-02-17 ENCOUNTER — Other Ambulatory Visit: Payer: Self-pay | Admitting: Otolaryngology

## 2016-02-17 DIAGNOSIS — R430 Anosmia: Secondary | ICD-10-CM

## 2016-03-12 ENCOUNTER — Ambulatory Visit
Admission: RE | Admit: 2016-03-12 | Discharge: 2016-03-12 | Disposition: A | Discharge status: Home / Self Care | Payer: BLUE CROSS/BLUE SHIELD | Source: Ambulatory Visit | Attending: Otolaryngology | Admitting: Otolaryngology

## 2016-03-12 DIAGNOSIS — J329 Chronic sinusitis, unspecified: Secondary | ICD-10-CM | POA: Insufficient documentation

## 2016-03-12 DIAGNOSIS — R430 Anosmia: Secondary | ICD-10-CM | POA: Diagnosis not present

## 2016-03-12 LAB — POCT I-STAT CREATININE: Creatinine, Ser: 0.8 mg/dL (ref 0.61–1.24)

## 2016-03-12 MED ORDER — GADOBENATE DIMEGLUMINE 529 MG/ML IV SOLN
20.0000 mL | Freq: Once | INTRAVENOUS | Status: AC | PRN
  Administered 2016-03-12: 19 mL via INTRAVENOUS

## 2016-04-16 ENCOUNTER — Other Ambulatory Visit: Payer: Self-pay | Admitting: Cardiovascular Disease

## 2016-06-05 ENCOUNTER — Ambulatory Visit: Payer: Self-pay | Admitting: Family Medicine

## 2016-06-10 ENCOUNTER — Encounter: Payer: Self-pay | Admitting: Family Medicine

## 2016-06-10 ENCOUNTER — Encounter: Payer: Self-pay | Admitting: *Deleted

## 2016-06-10 ENCOUNTER — Ambulatory Visit (INDEPENDENT_AMBULATORY_CARE_PROVIDER_SITE_OTHER): Payer: BLUE CROSS/BLUE SHIELD | Admitting: Family Medicine

## 2016-06-10 VITALS — BP 124/70 | HR 80 | Temp 98.7°F | Resp 16 | Wt 206.4 lb

## 2016-06-10 DIAGNOSIS — R222 Localized swelling, mass and lump, trunk: Secondary | ICD-10-CM

## 2016-06-10 NOTE — Progress Notes (Signed)
Subjective:     Patient ID: Christopher Cervantes, male   DOB: 1956/06/03, 60 y.o.   MRN: [id number]  HPI  Chief Complaint  Patient presents with  . Breast Mass    Patient comes in office today with concerns of lump in his left breast that has been present for the past 3 months or more.   States his wife has had breast cancer and she is concerned for him. He remains on Proscar.   Review of Systems     Objective:   Physical Exam  Constitutional: He appears well-developed and well-nourished. No distress.  Pulmonary/Chest:  Left chest wall medial to his breast is a 0.5 cm,mobile, non-tender, S.C.mass c/w lipoma       Assessment:    1. Mass of chest wall, left: patient wishes additional reassurance from surgeon - Ambulatory referral to General Surgery    Plan:    Reassured; referral made.

## 2016-06-10 NOTE — Patient Instructions (Signed)
We will call you about the referral. 

## 2016-06-22 ENCOUNTER — Ambulatory Visit: Payer: Self-pay | Admitting: General Surgery

## 2016-06-30 ENCOUNTER — Ambulatory Visit: Payer: Self-pay | Admitting: General Surgery

## 2016-07-16 ENCOUNTER — Encounter: Payer: Self-pay | Admitting: General Surgery

## 2016-07-16 ENCOUNTER — Ambulatory Visit (INDEPENDENT_AMBULATORY_CARE_PROVIDER_SITE_OTHER): Payer: BLUE CROSS/BLUE SHIELD | Admitting: General Surgery

## 2016-07-16 VITALS — BP 130/74 | HR 82 | Resp 14 | Ht 72.0 in | Wt 189.0 lb

## 2016-07-16 DIAGNOSIS — N6320 Unspecified lump in the left breast, unspecified quadrant: Secondary | ICD-10-CM | POA: Diagnosis not present

## 2016-07-16 DIAGNOSIS — N6310 Unspecified lump in the right breast, unspecified quadrant: Secondary | ICD-10-CM | POA: Diagnosis not present

## 2016-07-16 NOTE — Progress Notes (Signed)
Patient ID: Christopher Cervantes, male   DOB: Jan 10, 1956, 60 y.o.   MRN: [id number]  Chief Complaint  Patient presents with  . Mass    chest wall    HPI Christopher Cervantes is a 60 y.o. male.  Here today for evaluation of a left chest wall/breast mass. He states it has been there for about 3-4 months. He state sit maybe about "green pea" size. Denies pain. Denies any injury or trauma to the area. He states he thinks he can feel a very small knot on the right side as well. His wife was diagnosed with breast cancer a few years ago, And he recently completed his first breast self check.  HPI  Past Medical History:  Diagnosis Date  . GERD (gastroesophageal reflux disease)   . HLD (hyperlipidemia)   . HTN (hypertension)   . IBS (irritable bowel syndrome)     Past Surgical History:  Procedure Laterality Date  . TONSILLECTOMY    . VASECTOMY    . WISDOM TOOTH EXTRACTION      Family History  Problem Relation Age of Onset  . Hypertension Mother   . Hyperlipidemia Mother   . Cancer Father   . Hyperlipidemia Sister   . Hypertension Sister   . Hyperlipidemia Brother   . Hypertension Brother     Social History Social History  Substance Use Topics  . Smoking status: Former Smoker    Quit date: 12/21/1981  . Smokeless tobacco: Never Used  . Alcohol use Yes     Comment: 2    No Known Allergies  Current Outpatient Prescriptions  Medication Sig Dispense Refill  . ALPRAZolam (XANAX) 0.5 MG tablet Take 0.25 mg by mouth at bedtime as needed. Reported on 11/19/2015    . amLODipine-valsartan (EXFORGE) 10-320 MG tablet take 1 tablet by mouth once daily 30 tablet 3  . CALCIUM-VITAMIN D PO Take 600 mg by mouth daily.    . finasteride (PROSCAR) 5 MG tablet Take 5 mg by mouth as needed.     . Flaxseed Oil OIL 2,400 mg by Does not apply route daily.    . halobetasol (ULTRAVATE) 0.05 % ointment Reported on 11/19/2015  0  . nystatin cream (MYCOSTATIN) Apply 1 application topically 2 (two) times daily. Mix  with hydrocortisone cream in equal amounts 30 g 0  . Probiotic Product (SOLUBLE FIBER/PROBIOTICS PO) Take by mouth daily.    . Red Yeast Rice 600 MG CAPS Take 2 capsules by mouth every other day. Reported on 11/19/2015     No current facility-administered medications for this visit.     Review of Systems Review of Systems  Constitutional: Negative.   Respiratory: Negative.   Cardiovascular: Negative.     Blood pressure 130/74, pulse 82, resp. rate 14, height 6' (1.829 m), weight 189 lb (85.7 kg).  Physical Exam Physical Exam  Constitutional: He is oriented to person, place, and time. He appears well-developed and well-nourished.  HENT:  Mouth/Throat: Oropharynx is clear and moist.  Eyes: Conjunctivae are normal. No scleral icterus.  Neck: Neck supple.  Cardiovascular: Normal rate, regular rhythm and normal heart sounds.   Pulmonary/Chest: Effort normal and breath sounds normal. Right breast exhibits mass. Right breast exhibits no inverted nipple, no nipple discharge, no skin change and no tenderness. Left breast exhibits mass. Left breast exhibits no inverted nipple, no nipple discharge, no skin change and no tenderness.    Right breast mass 6 mm fatty nodule 6 cfn at 4 o'clock. Left breast at  7 o'clock 9 cfn a 1.2 cm skin cyst.  Lymphadenopathy:    He has no cervical adenopathy.    He has no axillary adenopathy.  Neurological: He is alert and oriented to person, place, and time.  Skin: Skin is warm and dry.  Psychiatric: His behavior is normal.    Assessment    Lipoma of the right breast.  Noninflamed sebaceous cyst of the left breast skin.    Plan         Observation and if the areas change he is to call for follow up. The patient is aware to call back for any questions or concerns.   This information has been scribed by Karie Fetch RN, BSN,BC.  Robert Bellow 07/16/2016, 8:31 PM

## 2016-07-16 NOTE — Patient Instructions (Addendum)
The patient is aware to call back for any questions or concerns. Observation and if the areas change he is to call for follow up.

## 2016-10-12 ENCOUNTER — Other Ambulatory Visit: Payer: Self-pay | Admitting: Cardiovascular Disease

## 2017-01-01 ENCOUNTER — Ambulatory Visit (INDEPENDENT_AMBULATORY_CARE_PROVIDER_SITE_OTHER): Payer: BLUE CROSS/BLUE SHIELD | Admitting: Family Medicine

## 2017-01-01 ENCOUNTER — Encounter: Payer: Self-pay | Admitting: Family Medicine

## 2017-01-01 DIAGNOSIS — L209 Atopic dermatitis, unspecified: Secondary | ICD-10-CM

## 2017-01-01 NOTE — Progress Notes (Signed)
Subjective:     Patient ID: Christopher Cervantes, male   DOB: [birth year], 61 y.o.   MRN: [id number]  HPI  Chief Complaint  Patient presents with  . Eczema    Patient comes in office today with concerns of dermatitis, patient states that he has had itching and redness on arms and legs for the past 2 weeks. Patient states that he previously saw dermatologist in past for same issue and was told he had topical dermatitis, patient has been applying Halobetasol Propiate ointment on skin and as of recently has started using Doyle.   Hx of atopic dermatitis. Was given a spray, Levicyn, which he states does not work well. Also uses hydroxyzine 10 mg.for itching. Not sure what triggers his flares and has scheduled a physical next week. Suggested to him that anxiety may be one trigger. States he does not tolerate prednisone well as it makes him jumpy.   Review of Systems     Objective:   Physical Exam  Constitutional: He appears well-developed and well-nourished. No distress.  Skin:  Multiple slightly raised papules and plaques on his extremities and lower trunk Back and face appear to be spared. Mild excoriation noted.       Assessment:    1. Atopic dermatitis, unspecified type     Plan:    patient defers Depo-medrol or prednisone. Suggested use of Claritin, Zantac 150 bid, and hydroxyzine every 6 hours. Do f/u with dermatology if not improving. Physical next week here.

## 2017-01-01 NOTE — Patient Instructions (Addendum)
Discussed use of antihistamine daily like Claritin along with hydroxyzine every 6 hours as needed. May also add Zantac 150 mg.twice daily until rash flares down.

## 2017-01-04 ENCOUNTER — Ambulatory Visit (INDEPENDENT_AMBULATORY_CARE_PROVIDER_SITE_OTHER): Payer: BLUE CROSS/BLUE SHIELD | Admitting: Family Medicine

## 2017-01-04 ENCOUNTER — Encounter: Payer: Self-pay | Admitting: Family Medicine

## 2017-01-04 VITALS — BP 122/82 | HR 83 | Temp 99.1°F | Resp 16 | Ht 70.5 in | Wt 198.0 lb

## 2017-01-04 DIAGNOSIS — L209 Atopic dermatitis, unspecified: Secondary | ICD-10-CM | POA: Diagnosis not present

## 2017-01-04 DIAGNOSIS — F439 Reaction to severe stress, unspecified: Secondary | ICD-10-CM

## 2017-01-04 DIAGNOSIS — Z1159 Encounter for screening for other viral diseases: Secondary | ICD-10-CM

## 2017-01-04 DIAGNOSIS — F419 Anxiety disorder, unspecified: Secondary | ICD-10-CM

## 2017-01-04 DIAGNOSIS — N138 Other obstructive and reflux uropathy: Secondary | ICD-10-CM

## 2017-01-04 DIAGNOSIS — E782 Mixed hyperlipidemia: Secondary | ICD-10-CM | POA: Diagnosis not present

## 2017-01-04 DIAGNOSIS — N401 Enlarged prostate with lower urinary tract symptoms: Secondary | ICD-10-CM

## 2017-01-04 DIAGNOSIS — D075 Carcinoma in situ of prostate: Secondary | ICD-10-CM

## 2017-01-04 DIAGNOSIS — Z0000 Encounter for general adult medical examination without abnormal findings: Secondary | ICD-10-CM

## 2017-01-04 DIAGNOSIS — I10 Essential (primary) hypertension: Secondary | ICD-10-CM

## 2017-01-04 MED ORDER — SERTRALINE HCL 50 MG PO TABS: 50.0000 mg | ORAL_TABLET | Freq: Every day | ORAL | 1 refills | Status: DC

## 2017-01-04 NOTE — Progress Notes (Signed)
Subjective:     Patient ID: Christopher Cervantes, male   DOB: 08-20-1956, 61 y.o.   MRN: [id number]  HPI  Chief Complaint  Patient presents with  . 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 Occasionally uses Xanax but wishes to try a SSRI for better control. Followed by Dr. Jacqlyn Larsen, urology and Dr. Rockey Situ, cardiology.   Review of Systems General: Feeling well, copes with stress by drinking 4-5 beers daily at times and using the Ellipitical daily for up to 5 miles. HEENT: regular dental visits and eye exams. Cardiovascular: no chest pain, shortness of breath, or palpitations GI: Heartburn controlled by otc Nexium. no change in bowel habits or blood in the stool. Normal colonoscopy in 2009. Believes he has a leaky bowel syndrome. GU:  no change in bladder habits. Pending urology f/u in June. Last PSA in 02/2016. Psychiatric: chronic anxiety and stress. Musculoskeletal: no joint pain Skin: States his rash seems to be triggered by food and states when he tried a gluten free diet it resolved. Has had prior allergy testing per ENT. Current rash improving with use of antihistamines and hydroxyzine.    Objective:   Physical Exam  Constitutional: He appears well-developed and well-nourished. No distress.  Eyes: PERRLA, EOMI Neck: no thyromegaly, tenderness or nodules, no cervical adenopathy or carotid bruits. ENT: TM's intact without inflammation; No tonsillar enlargement or exudate, Lungs: Clear Heart : RRR without murmur or gallop Abd: bowel sounds present, soft, non-tender, no organomegaly Extremities: no edema Skin:erythematous papules and plaques still present on his extremities. No truncal involvement.     Assessment:    1. Atopic dermatitis, unspecified type: ? Stress trigger ? Food trigger  2. Benign essential HTN: per cardiology - Comprehensive  metabolic panel  3. Situational stress - sertraline (ZOLOFT) 50 MG tablet; Take 1 tablet (50 mg total) by mouth daily. Start at 1/2 pill for the first 7 days.  Dispense: 30 tablet; Refill: 1  4. Encounter for hepatitis C screening test for low risk patient - Hepatitis C Antibody  5. Mixed hyperlipidemia - Lipid panel  6. Annual physical exam  7. Chronic anxiety - sertraline (ZOLOFT) 50 MG tablet; Take 1 tablet (50 mg total) by mouth daily. Start at 1/2 pill for the first 7 days.  Dispense: 30 tablet; Refill: 1  8. CA in situ prostate: per urology  9. Benign prostatic hyperplasia with urinary obstruction: per urology    Plan:   Further f/u pending lab results and in 2-4 weeks regarding sertraline.

## 2017-01-04 NOTE — Patient Instructions (Addendum)
We will call you with the lab results. Limit alcohol use to two drinks daily. Do follow up with Dr. Jacqlyn Larsen in June and Dr. Rockey Situ annually. Continue gluten free diet if it controls your dermatitis. Continue your daily exercise program.

## 2017-01-05 ENCOUNTER — Telehealth: Payer: Self-pay

## 2017-01-05 LAB — COMPREHENSIVE METABOLIC PANEL
ALT: 35 IU/L (ref 0–44)
AST: 31 IU/L (ref 0–40)
Albumin/Globulin Ratio: 1.5 (ref 1.2–2.2)
Albumin: 4.7 g/dL (ref 3.5–5.5)
Alkaline Phosphatase: 42 IU/L (ref 39–117)
BUN/Creatinine Ratio: 13 (ref 9–20)
BUN: 13 mg/dL (ref 6–24)
Bilirubin Total: 0.7 mg/dL (ref 0.0–1.2)
CO2: 25 mmol/L (ref 18–29)
Calcium: 10.1 mg/dL (ref 8.7–10.2)
Chloride: 93 mmol/L — ABNORMAL LOW (ref 96–106)
Creatinine, Ser: 1.04 mg/dL (ref 0.76–1.27)
GFR calc Af Amer: 91 mL/min/{1.73_m2} (ref >59)
GFR calc non Af Amer: 79 mL/min/{1.73_m2} (ref >59)
Globulin, Total: 3.2 g/dL (ref 1.5–4.5)
Glucose: 105 mg/dL — ABNORMAL HIGH (ref 65–99)
Potassium: 4.5 mmol/L (ref 3.5–5.2)
Sodium: 135 mmol/L (ref 134–144)
Total Protein: 7.9 g/dL (ref 6.0–8.5)

## 2017-01-05 LAB — LIPID PANEL
Chol/HDL Ratio: 2.2 ratio (ref 0.0–5.0)
Cholesterol, Total: 292 mg/dL — ABNORMAL HIGH (ref 100–199)
HDL: 130 mg/dL (ref >39)
LDL Calculated: 147 mg/dL — ABNORMAL HIGH (ref 0–99)
Triglycerides: 76 mg/dL (ref 0–149)
VLDL Cholesterol Cal: 15 mg/dL (ref 5–40)

## 2017-01-05 LAB — HEPATITIS C ANTIBODY: Hep C Virus Ab: <0.1 s/co ratio (ref 0.0–0.9)

## 2017-01-05 NOTE — Telephone Encounter (Signed)
Patient has been advised. KW 

## 2017-01-05 NOTE — Telephone Encounter (Signed)
-----   Message from Carmon Ginsberg, [place] sent at 01/05/2017  7:45 AM EDT ----- Labs are ok. Cholesterol is mildly elevated with calculated 10 year risk of developing cardiovascular disease of 6.7% which is below the 7.5% risk when we recommend cholesterol lowering medication. Continue your exercise program and the red yeast rice as you are doing.

## 2017-01-25 ENCOUNTER — Telehealth: Payer: Self-pay | Admitting: Gastroenterology

## 2017-01-25 NOTE — Telephone Encounter (Signed)
Patient would like to schedule a colonoscopy with Dr. Allen Norris

## 2017-01-26 ENCOUNTER — Other Ambulatory Visit: Payer: Self-pay

## 2017-01-26 DIAGNOSIS — Z86010 Personal history of colonic polyps: Secondary | ICD-10-CM

## 2017-01-26 NOTE — Telephone Encounter (Signed)
Gastroenterology Pre-Procedure Review  Request Date:  Requesting Physician: Dr.   PATIENT REVIEW QUESTIONS: The patient responded to the following health history questions as indicated:    1. Are you having any GI issues? no 2. Do you have a personal history of Polyps? yes (5 years ago) 3. Do you have a family history of Colon Cancer or Polyps? no 4. Diabetes Mellitus? no 5. Joint replacements in the past 12 months?no 6. Major health problems in the past 3 months?no 7. Any artificial heart valves, MVP, or defibrillator?no    MEDICATIONS & ALLERGIES:    Patient reports the following regarding taking any anticoagulation/antiplatelet therapy:   Plavix, Coumadin, Eliquis, Xarelto, Lovenox, Pradaxa, Brilinta, or Effient? no Aspirin? no  Patient confirms/reports the following medications:  Current Outpatient Prescriptions  Medication Sig Dispense Refill  . ALPRAZolam (XANAX) 0.5 MG tablet Take 0.25 mg by mouth at bedtime as needed. Reported on 11/19/2015    . amLODipine-valsartan (EXFORGE) 10-320 MG tablet take 1 tablet by mouth once daily 30 tablet 3  . CALCIUM-VITAMIN D PO Take 600 mg by mouth daily.    . Dermatological Products, Misc. (LEVICYN) GEL APPLY TO THE AFFECTED AREA EVERY DAY  0  . esomeprazole (NEXIUM) 10 MG packet Take 10 mg by mouth daily before breakfast.    . finasteride (PROSCAR) 5 MG tablet Take 5 mg by mouth as needed.     . Flaxseed Oil OIL 2,400 mg by Does not apply route daily.    . halobetasol (ULTRAVATE) 0.05 % ointment Reported on 11/19/2015  0  . Probiotic Product (SOLUBLE FIBER/PROBIOTICS PO) Take by mouth daily.    . Red Yeast Rice 600 MG CAPS Take 2 capsules by mouth every other day. Reported on 11/19/2015    . sertraline (ZOLOFT) 50 MG tablet Take 1 tablet (50 mg total) by mouth daily. Start at 1/2 pill for the first 7 days. 30 tablet 1   No current facility-administered medications for this visit.     Patient confirms/reports the following allergies:  No  Known Allergies  No orders of the defined types were placed in this encounter.   AUTHORIZATION INFORMATION Primary Insurance: 1D#: Group #:  Secondary Insurance: 1D#: Group #:  SCHEDULE INFORMATION: Date: 02/05/17 Time: Location: [place]

## 2017-01-28 ENCOUNTER — Encounter: Payer: Self-pay | Admitting: *Deleted

## 2017-01-29 ENCOUNTER — Encounter: Payer: Self-pay | Admitting: Anesthesiology

## 2017-02-03 ENCOUNTER — Telehealth: Payer: Self-pay | Admitting: Gastroenterology

## 2017-02-03 ENCOUNTER — Telehealth: Payer: Self-pay

## 2017-02-03 NOTE — Telephone Encounter (Signed)
Please call patient as he has to r/s his procedure for this Friday 02/05/17 due to bad eczema.

## 2017-02-03 NOTE — Telephone Encounter (Signed)
Pt called back to reschedule colonoscopy due to eczema. He has been moved from 02/05/17 to 02/12/17.  Message has been left with Kim/ Debbie at [facility] to move pts appt.

## 2017-02-03 NOTE — Telephone Encounter (Signed)
Returned pts call to reschedule colonoscopy no answer.  Left a voice mail for him to call back to reschedule.  Colonoscopy has NOT been canceled at this time- awaiting to speak with patient before rescheduling.

## 2017-02-09 ENCOUNTER — Telehealth: Payer: Self-pay | Admitting: Gastroenterology

## 2017-02-09 NOTE — Telephone Encounter (Signed)
Patient called to cancel his colonoscopy due to a family issue. He will call back to reschedule. I called [facility]

## 2017-02-12 ENCOUNTER — Ambulatory Visit
Admission: RE | Admit: 2017-02-12 | Payer: BLUE CROSS/BLUE SHIELD | Source: Ambulatory Visit | Admitting: Gastroenterology

## 2017-02-12 HISTORY — DX: Presence of dental prosthetic device (complete) (partial): Z97.2

## 2017-02-12 SURGERY — COLONOSCOPY WITH PROPOFOL
Anesthesia: Choice

## 2017-03-11 NOTE — Progress Notes (Signed)
Cardiology Office Note  Date:  03/12/2017   ID:  Christopher Cervantes, Christopher Cervantes 1956/08/10, MRN [id number]  PCP:  Christopher Cervantes., MD   Chief Complaint  Patient presents with  . other    12 month f/u no complaints today. Meds reviewed verbally with pt.    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 Reports he went 6 miles today Denies any shortness of breath or chest pain  He is taking one half blood pressure pill amlodipine valsartan  Reports excellent control of his blood pressure Cholesterol is up to 290, taking less red yeast rice Never tried a statin but was worried about side effects  EKG on today's visit shows normal sinus rhythm with rate 66 bpm, no significant ST or T-wave changes  Other past medical history  no significant family history of coronary artery disease.  PMH:   has a past medical history of Dental bridge present; GERD (gastroesophageal reflux disease); HLD (hyperlipidemia); HTN (hypertension); and IBS (irritable bowel syndrome).  PSH:    Past Surgical History:  Procedure Laterality Date  . TONSILLECTOMY    . VASECTOMY    . WISDOM TOOTH EXTRACTION      Current Outpatient Prescriptions  Medication Sig Dispense Refill  . ALPRAZolam (XANAX) 0.5 MG tablet Take 0.25 mg by mouth at bedtime as needed. Reported on 11/19/2015    . amLODipine-valsartan (EXFORGE) 10-320 MG tablet take 1/2 tablet by mouth once daily 30 tablet 3  . CALCIUM-VITAMIN D PO Take 600 mg by mouth daily.     . Coenzyme Q10 (COQ10 PO) Take by mouth.    . Cyanocobalamin (VITAMIN B-12 PO) Take by mouth.    . Dermatological Products, Misc. (LEVICYN) GEL APPLY TO THE AFFECTED AREA EVERY DAY  0  . esomeprazole (NEXIUM) 10 MG packet Take 10 mg by mouth daily before breakfast.    . finasteride (PROSCAR) 5 MG tablet Take 5 mg by mouth as needed.     . halobetasol (ULTRAVATE) 0.05 % ointment Reported on 11/19/2015  0  . Omega-3 Fatty Acids (FISH OIL PO) Take by mouth.    . Probiotic Product (SOLUBLE FIBER/PROBIOTICS PO) Take by mouth daily.    . Red Yeast Rice 600 MG CAPS Take 2 capsules by mouth every other day. Reported on 11/19/2015     No current facility-administered medications for this visit.      Allergies:   Lactose intolerance (gi); Shellfish allergy; and Wheat bran   Social History:  The patient  reports that he quit smoking about 35 years ago. He has never used smokeless tobacco. He reports that he drinks about 1.2 oz of alcohol per week . He reports that he does not use drugs.   Family History:   family history includes Cancer in his father; Hyperlipidemia in his brother, mother, and sister; Hypertension in his brother, mother, and sister.    Review of Systems: Review of Systems  Constitutional: Negative.   Respiratory: Negative.   Cardiovascular: Negative.   Gastrointestinal:  Negative.   Musculoskeletal: Negative.   Neurological: Negative.   Psychiatric/Behavioral: Negative.   All other systems reviewed and are negative.    PHYSICAL EXAM: VS:  BP 128/75 (BP Location: Left Arm, Patient Position: Sitting, Cuff Size: Normal)   Pulse 66   Ht 6' (1.829 m)   Wt 199 lb 4 oz (90.4 kg)   BMI 27.02 kg/m  , BMI Body mass index is 27.02 kg/m. GEN: Well nourished, well developed, in no acute distress  HEENT: normal  Neck: no JVD, carotid bruits, or masses Cardiac: RRR; no murmurs, rubs, or gallops,no edema  Respiratory:  clear to auscultation bilaterally, normal work of  breathing GI: soft, nontender, nondistended, + BS MS: no deformity or atrophy  Skin: warm and dry, no rash Neuro:  Strength and sensation are intact Psych: euthymic mood, full affect    Recent Labs: 01/04/2017: ALT 35; BUN 13; Creatinine, Ser 1.04; Potassium 4.5; Sodium 135    Lipid Panel Lab Results  Component Value Date   CHOL 292 (H) 01/04/2017   HDL 130 01/04/2017   LDLCALC 147 (H) 01/04/2017   TRIG 76 01/04/2017      Wt Readings from Last 3 Encounters:  03/12/17 199 lb 4 oz (90.4 kg)  01/04/17 198 lb (89.8 kg)  01/01/17 201 lb (91.2 kg)       ASSESSMENT AND PLAN:  Benign essential HTN - Plan: EKG 12-Lead Blood pressure is well controlled on today's visit. No changes made to the medications.  Mixed hyperlipidemia - Plan: EKG 12-Lead Long discussion concerning management of his cholesterol He is not having much success with red yeast rice Suggested he start Crestor 5 mg daily Discussed risk and benefit, possible side effects. 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 EKG 12-Lead     Signed, Christopher Cervantes, M.D., Ph.D. 03/12/2017  [place], [place]

## 2017-03-12 ENCOUNTER — Encounter: Payer: Self-pay | Admitting: Cardiovascular Disease

## 2017-03-12 ENCOUNTER — Ambulatory Visit (INDEPENDENT_AMBULATORY_CARE_PROVIDER_SITE_OTHER): Payer: BLUE CROSS/BLUE SHIELD | Admitting: Cardiovascular Disease

## 2017-03-12 VITALS — BP 128/75 | HR 66 | Ht 72.0 in | Wt 199.2 lb

## 2017-03-12 DIAGNOSIS — E782 Mixed hyperlipidemia: Secondary | ICD-10-CM

## 2017-03-12 DIAGNOSIS — F419 Anxiety disorder, unspecified: Secondary | ICD-10-CM | POA: Diagnosis not present

## 2017-03-12 DIAGNOSIS — I10 Essential (primary) hypertension: Secondary | ICD-10-CM

## 2017-03-12 MED ORDER — ROSUVASTATIN CALCIUM 5 MG PO TABS: 5.0000 mg | ORAL_TABLET | Freq: Every day | ORAL | 3 refills | Status: DC

## 2017-03-12 NOTE — Patient Instructions (Signed)
Medication Instructions:   Please start crestor 5 mg daily, lowest dose  Labwork:  No new labs needed  Testing/Procedures:  No further testing at this time   Follow-Up: It was a pleasure seeing you in the office today. Please call us if you have new issues that need to be addressed before your next appt.  [phone]  Your physician wants you to follow-up in: 12 months.  You will receive a reminder letter in the mail two months in advance. If you don't receive a letter, please call our office to schedule the follow-up appointment.  If you need a refill on your cardiac medications before your next appointment, please call your pharmacy.

## 2017-05-17 ENCOUNTER — Other Ambulatory Visit: Payer: Self-pay | Admitting: Cardiovascular Disease

## 2017-08-05 ENCOUNTER — Telehealth: Payer: Self-pay | Admitting: Gastroenterology

## 2017-08-05 NOTE — Telephone Encounter (Signed)
Patient called and is ready to be set up for a colonoscopy. He is a Dr. Allen Norris patient.

## 2017-08-05 NOTE — Telephone Encounter (Signed)
LVM for pt to return my call.

## 2017-08-06 ENCOUNTER — Other Ambulatory Visit: Payer: Self-pay

## 2017-08-06 DIAGNOSIS — Z86010 Personal history of colonic polyps: Secondary | ICD-10-CM

## 2017-08-06 MED ORDER — PEG 3350-KCL-NA BICARB-NACL 420 G PO SOLR: 4000.0000 mL | Freq: Once | ORAL | 0 refills | Status: AC

## 2017-08-06 NOTE — Progress Notes (Signed)
Pt rescheduled screening colonoscopy with Dr. Allen Norris.   Mailed prep instructions.

## 2017-08-17 ENCOUNTER — Telehealth: Payer: Self-pay | Admitting: Gastroenterology

## 2017-08-17 NOTE — Telephone Encounter (Signed)
Left vm for pt to return my call regarding paperwork.

## 2017-08-17 NOTE — Telephone Encounter (Signed)
Patient needs to r/s procedure due to wife sick and he never received the prepping information, please call.

## 2017-09-09 ENCOUNTER — Other Ambulatory Visit: Payer: Self-pay

## 2017-09-09 ENCOUNTER — Encounter: Payer: Self-pay | Admitting: *Deleted

## 2017-09-15 ENCOUNTER — Other Ambulatory Visit: Payer: Self-pay

## 2017-09-15 ENCOUNTER — Telehealth: Payer: Self-pay | Admitting: Gastroenterology

## 2017-09-15 MED ORDER — PEG 3350-KCL-NABCB-NACL-NASULF 236 G PO SOLR: ORAL | 0 refills | Status: DC

## 2017-09-15 NOTE — Telephone Encounter (Signed)
Please call bowel prep into [facility] in [place]. Patient never received Rx.

## 2017-09-15 NOTE — Telephone Encounter (Signed)
Bowel prep called into pt's pharmacy per his request.

## 2017-09-15 NOTE — Telephone Encounter (Signed)
Patient left a voice message that he needs is prep called in today to [facility] in [place]. Please call him as soon as you do.

## 2017-09-16 NOTE — Discharge Instructions (Signed)
General Anesthesia, Adult, Care After °These instructions provide you with information about caring for yourself after your procedure. Your health care provider may also give you more specific instructions. Your treatment has been planned according to current medical practices, but problems sometimes occur. Call your health care provider if you have any problems or questions after your procedure. °What can I expect after the procedure? °After the procedure, it is common to have: °· Vomiting. °· A sore throat. °· Mental slowness. ° °It is common to feel: °· Nauseous. °· Cold or shivery. °· Sleepy. °· Tired. °· Sore or achy, even in parts of your body where you did not have surgery. ° °Follow these instructions at home: °For at least 24 hours after the procedure: °· Do not: °? Participate in activities where you could fall or become injured. °? Drive. °? Use heavy machinery. °? Drink alcohol. °? Take sleeping pills or medicines that cause drowsiness. °? Make important decisions or sign legal documents. °? Take care of children on your own. °· Rest. °Eating and drinking °· If you vomit, drink water, juice, or soup when you can drink without vomiting. °· Drink enough fluid to keep your urine clear or pale yellow. °· Make sure you have little or no nausea before eating solid foods. °· Follow the diet recommended by your health care provider. °General instructions °· Have a responsible adult stay with you until you are awake and alert. °· Return to your normal activities as told by your health care provider. Ask your health care provider what activities are safe for you. °· Take over-the-counter and prescription medicines only as told by your health care provider. °· If you smoke, do not smoke without supervision. °· Keep all follow-up visits as told by your health care provider. This is important. °Contact a health care provider if: °· You continue to have nausea or vomiting at home, and medicines are not helpful. °· You  cannot drink fluids or start eating again. °· You cannot urinate after 8-12 hours. °· You develop a skin rash. °· You have fever. °· You have increasing redness at the site of your procedure. °Get help right away if: °· You have difficulty breathing. °· You have chest pain. °· You have unexpected bleeding. °· You feel that you are having a life-threatening or urgent problem. °This information is not intended to replace advice given to you by your health care provider. Make sure you discuss any questions you have with your health care provider. °Document Released: 11/30/2000 Document Revised: 01/27/2016 Document Reviewed: 08/08/2015 °Elsevier Interactive Patient Education © 2018 Elsevier Inc. ° °

## 2017-09-17 ENCOUNTER — Encounter
Admission: RE | Disposition: A | Discharge status: Home / Self Care | Payer: Self-pay | Source: Ambulatory Visit | Attending: Gastroenterology

## 2017-09-17 ENCOUNTER — Ambulatory Visit: Payer: BLUE CROSS/BLUE SHIELD | Admitting: Anesthesiology

## 2017-09-17 ENCOUNTER — Ambulatory Visit
Admission: RE | Admit: 2017-09-17 | Discharge: 2017-09-17 | Disposition: A | Discharge status: Home / Self Care | Payer: BLUE CROSS/BLUE SHIELD | Source: Ambulatory Visit | Attending: Gastroenterology | Admitting: Gastroenterology

## 2017-09-17 DIAGNOSIS — I10 Essential (primary) hypertension: Secondary | ICD-10-CM | POA: Diagnosis not present

## 2017-09-17 DIAGNOSIS — E785 Hyperlipidemia, unspecified: Secondary | ICD-10-CM | POA: Diagnosis not present

## 2017-09-17 DIAGNOSIS — K219 Gastro-esophageal reflux disease without esophagitis: Secondary | ICD-10-CM | POA: Diagnosis not present

## 2017-09-17 DIAGNOSIS — Z1211 Encounter for screening for malignant neoplasm of colon: Secondary | ICD-10-CM | POA: Diagnosis not present

## 2017-09-17 DIAGNOSIS — K641 Second degree hemorrhoids: Secondary | ICD-10-CM | POA: Insufficient documentation

## 2017-09-17 DIAGNOSIS — Z91013 Allergy to seafood: Secondary | ICD-10-CM | POA: Diagnosis not present

## 2017-09-17 DIAGNOSIS — Z8546 Personal history of malignant neoplasm of prostate: Secondary | ICD-10-CM | POA: Insufficient documentation

## 2017-09-17 DIAGNOSIS — D123 Benign neoplasm of transverse colon: Secondary | ICD-10-CM | POA: Diagnosis not present

## 2017-09-17 DIAGNOSIS — Z860100 Personal history of colon polyps, unspecified: Secondary | ICD-10-CM

## 2017-09-17 DIAGNOSIS — Z91018 Allergy to other foods: Secondary | ICD-10-CM | POA: Diagnosis not present

## 2017-09-17 DIAGNOSIS — F419 Anxiety disorder, unspecified: Secondary | ICD-10-CM | POA: Diagnosis not present

## 2017-09-17 DIAGNOSIS — Z79899 Other long term (current) drug therapy: Secondary | ICD-10-CM | POA: Diagnosis not present

## 2017-09-17 DIAGNOSIS — D120 Benign neoplasm of cecum: Secondary | ICD-10-CM

## 2017-09-17 DIAGNOSIS — K589 Irritable bowel syndrome without diarrhea: Secondary | ICD-10-CM | POA: Diagnosis not present

## 2017-09-17 DIAGNOSIS — Z87891 Personal history of nicotine dependence: Secondary | ICD-10-CM | POA: Insufficient documentation

## 2017-09-17 DIAGNOSIS — E739 Lactose intolerance, unspecified: Secondary | ICD-10-CM | POA: Insufficient documentation

## 2017-09-17 HISTORY — PX: COLONOSCOPY WITH PROPOFOL: SHX5780

## 2017-09-17 HISTORY — PX: POLYPECTOMY: SHX149

## 2017-09-17 SURGERY — COLONOSCOPY WITH PROPOFOL
Anesthesia: General | Wound class: Contaminated

## 2017-09-17 MED ORDER — PROPOFOL 10 MG/ML IV BOLUS
INTRAVENOUS | Status: DC | PRN
  Administered 2017-09-17: 50 mg via INTRAVENOUS
  Administered 2017-09-17 (×2): 30 mg via INTRAVENOUS
  Administered 2017-09-17: 20 mg via INTRAVENOUS
  Administered 2017-09-17: 50 mg via INTRAVENOUS
  Administered 2017-09-17: 30 mg via INTRAVENOUS
  Administered 2017-09-17: 40 mg via INTRAVENOUS
  Administered 2017-09-17: 50 mg via INTRAVENOUS
  Administered 2017-09-17: 150 mg via INTRAVENOUS
  Administered 2017-09-17: 20 mg via INTRAVENOUS
  Administered 2017-09-17: 50 mg via INTRAVENOUS

## 2017-09-17 MED ORDER — ACETAMINOPHEN 160 MG/5ML PO SOLN: 325.0000 mg | ORAL | Status: DC | PRN

## 2017-09-17 MED ORDER — LIDOCAINE HCL (CARDIAC) 20 MG/ML IV SOLN
INTRAVENOUS | Status: DC | PRN
  Administered 2017-09-17: 50 mg via INTRAVENOUS

## 2017-09-17 MED ORDER — ONDANSETRON HCL 4 MG/2ML IJ SOLN: 4.0000 mg | Freq: Once | INTRAMUSCULAR | Status: DC | PRN

## 2017-09-17 MED ORDER — LACTATED RINGERS IV SOLN
10.0000 mL/h | INTRAVENOUS | Status: DC
  Administered 2017-09-17: 09:00:00 via INTRAVENOUS

## 2017-09-17 MED ORDER — SODIUM CHLORIDE 0.9 % IV SOLN: INTRAVENOUS | Status: DC

## 2017-09-17 MED ORDER — ACETAMINOPHEN 325 MG PO TABS: 650.0000 mg | ORAL_TABLET | Freq: Once | ORAL | Status: DC | PRN

## 2017-09-17 SURGICAL SUPPLY — 24 items
CANISTER SUCT 1200ML W/VALVE (MISCELLANEOUS) ×3 IMPLANT
CLIP HMST 235XBRD CATH ROT (MISCELLANEOUS) IMPLANT
CLIP RESOLUTION 360 11X235 (MISCELLANEOUS)
ELECT REM PT RETURN 9FT ADLT (ELECTROSURGICAL)
ELECTRODE REM PT RTRN 9FT ADLT (ELECTROSURGICAL) IMPLANT
FCP ESCP3.2XJMB 240X2.8X (MISCELLANEOUS)
FORCEPS BIOP RAD 4 LRG CAP 4 (CUTTING FORCEPS) IMPLANT
FORCEPS BIOP RJ4 240 W/NDL (MISCELLANEOUS)
FORCEPS ESCP3.2XJMB 240X2.8X (MISCELLANEOUS) IMPLANT
GOWN CVR UNV OPN BCK APRN NK (MISCELLANEOUS) ×4 IMPLANT
GOWN ISOL THUMB LOOP REG UNIV (MISCELLANEOUS) ×6
INJECTOR VARIJECT VIN23 (MISCELLANEOUS) IMPLANT
KIT DEFENDO VALVE AND CONN (KITS) IMPLANT
KIT ENDO PROCEDURE OLY (KITS) ×3 IMPLANT
MARKER SPOT ENDO TATTOO 5ML (MISCELLANEOUS) IMPLANT
PROBE APC STR FIRE (PROBE) IMPLANT
RETRIEVER NET ROTH 2.5X230 LF (MISCELLANEOUS) IMPLANT
SNARE SHORT THROW 13M SML OVAL (MISCELLANEOUS) ×1 IMPLANT
SNARE SHORT THROW 30M LRG OVAL (MISCELLANEOUS) IMPLANT
SNARE SNG USE RND 15MM (INSTRUMENTS) IMPLANT
SPOT EX ENDOSCOPIC TATTOO (MISCELLANEOUS)
TRAP ETRAP POLY (MISCELLANEOUS) ×2 IMPLANT
VARIJECT INJECTOR VIN23 (MISCELLANEOUS)
WATER STERILE IRR 250ML POUR (IV SOLUTION) ×3 IMPLANT

## 2017-09-17 NOTE — Op Note (Addendum)
[facility] Gastroenterology Patient Name: Christopher Cervantes Procedure Date: 09/17/2017 8:18 AM MRN: [id number] Account #: [id number] Date of Birth: 02-19-1956 Admit Type: Outpatient Age: 62 Room: [facility] OR ROOM 01 Gender: Male Note Status: Finalized Procedure:            Colonoscopy Indications:          High risk colon cancer surveillance: Personal history                        of colonic polyps Providers:            Lucilla Lame MD, MD Referring MD:         Janine Ores. Rosanna Randy, MD (Referring MD) Medicines:            Propofol per Anesthesia Complications:        No immediate complications. Procedure:            Pre-Anesthesia Assessment:                       - Prior to the procedure, a History and Physical was                        performed, and patient medications and allergies were                        reviewed. The patient's tolerance of previous                        anesthesia was also reviewed. The risks and benefits of                        the procedure and the sedation options and risks were                        discussed with the patient. All questions were                        answered, and informed consent was obtained. Prior                        Anticoagulants: The patient has taken no previous                        anticoagulant or antiplatelet agents. ASA Grade                        Assessment: II - A patient with mild systemic disease.                        After reviewing the risks and benefits, the patient was                        deemed in satisfactory condition to undergo the                        procedure.                       After obtaining informed consent, the colonoscope was  passed under direct vision. Throughout the procedure,                        the patient's blood pressure, pulse, and oxygen                        saturations were monitored continuously. The [place] [id number]) was introduced through the                        anus and advanced to the the cecum, identified by                        appendiceal orifice and ileocecal valve. The                        colonoscopy was performed without difficulty. The                        patient tolerated the procedure well. The quality of                        the bowel preparation was good. Findings:      The perianal and digital rectal examinations were normal.      A 3 mm polyp was found in the cecum. The polyp was sessile. The polyp       was removed with a cold snare. Resection and retrieval were complete.      A 3 mm polyp was found in the transverse colon. The polyp was sessile.       The polyp was removed with a cold snare. Resection and retrieval were       complete.      A 4 mm polyp was found in the transverse colon. The polyp was sessile.       The polyp was removed with a cold snare. Resection and retrieval were       complete.      Non-bleeding internal hemorrhoids were found during retroflexion. The       hemorrhoids were Grade II (internal hemorrhoids that prolapse but reduce       spontaneously). Impression:           - One 3 mm polyp in the cecum, removed with a cold                        snare. Resected and retrieved.                       - One 3 mm polyp in the transverse colon, removed with                        a cold snare. Resected and retrieved.                       - One 4 mm polyp in the transverse colon, removed with                        a cold snare. Resected and retrieved.                       -  Non-bleeding internal hemorrhoids. Recommendation:       - Discharge patient to home.                       - Resume previous diet.                       - Continue present medications.                       - Repeat colonoscopy in 5 years for surveillance. Procedure Code(s):    --- Professional ---                       [phone], Colonoscopy, flexible; with  removal of tumor(s),                        polyp(s), or other lesion(s) by snare technique Diagnosis Code(s):    --- Professional ---                       Z86.010, Personal history of colonic polyps                       D12.0, Benign neoplasm of cecum                       D12.3, Benign neoplasm of transverse colon (hepatic                        flexure or splenic flexure) CPT copyright 2016 American Medical Association. All rights reserved. The codes documented in this report are preliminary and upon coder review may  be revised to meet current compliance requirements. Lucilla Lame MD, MD 09/17/2017 9:00:53 AM This report has been signed electronically. Number of Addenda: 0 Note Initiated On: 09/17/2017 8:18 AM Scope Withdrawal Time: 0 hours 9 minutes 24 seconds  Total Procedure Duration: 0 hours 17 minutes 18 seconds       [facility]

## 2017-09-17 NOTE — Transfer of Care (Signed)
Immediate Anesthesia Transfer of Care Note  Patient: Christopher Cervantes  Procedure(s) Performed: COLONOSCOPY WITH PROPOFOL (N/A ) POLYPECTOMY INTESTINAL  Patient Location: PACU  Anesthesia Type: General  Level of Consciousness: awake, alert  and patient cooperative  Airway and Oxygen Therapy: Patient Spontanous Breathing and Patient connected to supplemental oxygen  Post-op Assessment: Post-op Vital signs reviewed, Patient's Cardiovascular Status Stable, Respiratory Function Stable, Patent Airway and No signs of Nausea or vomiting  Post-op Vital Signs: Reviewed and stable  Complications: No apparent anesthesia complications

## 2017-09-17 NOTE — H&P (Signed)
Lucilla Lame, MD [address] [place], [place] [address] [phone] Fax : [phone]  Primary Care Physician:  Jerrol Banana., MD Primary Gastroenterologist:  Dr. Allen Norris  Pre-Procedure History & Physical: HPI:  Christopher Cervantes is a 62 y.o. male is here for an colonoscopy.   Past Medical History:  Diagnosis Date  . Dental bridge present    top right  . GERD (gastroesophageal reflux disease)   . HLD (hyperlipidemia)   . HTN (hypertension)   . IBS (irritable bowel syndrome)     Past Surgical History:  Procedure Laterality Date  . TONSILLECTOMY    . VASECTOMY    . WISDOM TOOTH EXTRACTION      Prior to Admission medications   Medication Sig Start Date End Date Taking? Authorizing Provider  ALPRAZolam Duanne Moron) 0.5 MG tablet Take 0.25 mg by mouth at bedtime as needed. Reported on 11/19/2015 01/28/13  Yes [provider]  amLODipine-valsartan (EXFORGE) 10-320 MG tablet TAKE 1 TABLET BY MOUTH ONCE DAILY 05/17/17  Yes Gollan, Kathlene November, MD  betamethasone valerate (VALISONE) 0.1 % cream APPLY TO THE AFFECTED AREA OF ECZEMA AS NEEDED FOR FLARES TWICE DAILY 07/27/17  Yes [provider]  CALCIUM-VITAMIN D PO Take 600 mg by mouth daily.   Yes [provider]  Coenzyme Q10 (COQ10 PO) Take by mouth.   Yes [provider]  Cyanocobalamin (VITAMIN B-12 PO) Take by mouth.   Yes [provider]  esomeprazole (NEXIUM) 10 MG packet Take 10 mg by mouth daily before breakfast.   Yes [provider]  Omega-3 Fatty Acids (FISH OIL PO) Take by mouth.   Yes [provider]  Probiotic Product (SOLUBLE FIBER/PROBIOTICS PO) Take by mouth daily.   Yes [provider]  Red Yeast Rice 600 MG CAPS Take 2 capsules by mouth every other day. Reported on 11/19/2015   Yes [provider]  Dermatological Products, [place]. (LEVICYN) GEL APPLY TO THE AFFECTED AREA EVERY DAY 12/30/16   [provider]    finasteride (PROSCAR) 5 MG tablet Take 5 mg by mouth as needed.  01/16/13   [provider]  halobetasol (ULTRAVATE) 0.05 % ointment Reported on 11/19/2015 03/04/15   [provider]  mometasone (ELOCON) 0.1 % cream APP EXT AA BID FOR 3 DAYS 05/18/17   [provider]  pentoxifylline (TRENTAL) 400 MG CR tablet  05/27/17   [provider]  polyethylene glycol (GOLYTELY) 236 g solution Drink one 8 oz glass every 20 mins until stools are clear starting at 5:00pm day before procedure 09/15/17   Lucilla Lame, MD  rosuvastatin (CRESTOR) 5 MG tablet Take 1 tablet (5 mg total) by mouth daily. Patient not taking: Reported on 09/09/2017 03/12/17   Minna Merritts, MD    Allergies as of 08/06/2017 - Review Complete 03/12/2017  Allergen Reaction Noted  . Lactose intolerance (gi)  01/28/2017  . Shellfish allergy  01/28/2017  . Wheat bran  01/28/2017    Family History  Problem Relation Age of Onset  . Hypertension Mother   . Hyperlipidemia Mother   . Cancer Father   . Hyperlipidemia Sister   . Hypertension Sister   . Hyperlipidemia Brother   . Hypertension Brother     Social History   Socioeconomic History  . Marital status: Married    Spouse name: Not on file  . Number of children: Not on file  . Years of education: Not on file  . Highest education level:  Not on file  Social Needs  . Financial resource strain: Not on file  . Food insecurity - worry: Not on file  . Food insecurity - inability: Not on file  . Transportation needs - medical: Not on file  . Transportation needs - non-medical: Not on file  Occupational History  . Not on file  Tobacco Use  . Smoking status: Former Smoker    Last attempt to quit: 12/21/1981    Years since quitting: 35.7  . Smokeless tobacco: Never Used  Substance and Sexual Activity  . Alcohol use: Yes    Alcohol/week: 1.2 oz    Types: 2 Glasses of wine per week    Comment:    . Drug use: No  . Sexual activity: Not on  file  Other Topics Concern  . Not on file  Social History Narrative  . Not on file    Review of Systems: See HPI, otherwise negative ROS  Physical Exam: BP (!) 145/84   Pulse 62   Temp (!) 97.5 F (36.4 C) (Temporal)   Ht 5' 10.5" (1.791 m)   Wt 198 lb (89.8 kg)   SpO2 100%   BMI 28.01 kg/m  General:   Alert,  pleasant and cooperative in NAD Head:  Normocephalic and atraumatic. Neck:  Supple; no masses or thyromegaly. Lungs:  Clear throughout to auscultation.    Heart:  Regular rate and rhythm. Abdomen:  Soft, nontender and nondistended. Normal bowel sounds, without guarding, and without rebound.   Neurologic:  Alert and  oriented x4;  grossly normal neurologically.  Impression/Plan: Christopher Cervantes is here for an colonoscopy to be performed for history of colon polyps  Risks, benefits, limitations, and alternatives regarding  colonoscopy have been reviewed with the patient.  Questions have been answered.  All parties agreeable.   Lucilla Lame, MD  09/17/2017, 8:20 AM

## 2017-09-17 NOTE — Anesthesia Procedure Notes (Signed)
Date/Time: 09/17/2017 8:32 AM Performed by: Cameron Ali, CRNA Pre-anesthesia Checklist: Patient identified, Emergency Drugs available, Suction available, Timeout performed and Patient being monitored Patient Re-evaluated:Patient Re-evaluated prior to induction Oxygen Delivery Method: Nasal cannula Placement Confirmation: positive ETCO2

## 2017-09-17 NOTE — Anesthesia Postprocedure Evaluation (Signed)
Anesthesia Post Note  Patient: Christopher Cervantes  Procedure(s) Performed: COLONOSCOPY WITH PROPOFOL (N/A ) POLYPECTOMY INTESTINAL  Patient location during evaluation: PACU Anesthesia Type: General Level of consciousness: awake and alert, oriented and patient cooperative Pain management: pain level controlled Vital Signs Assessment: post-procedure vital signs reviewed and stable Respiratory status: spontaneous breathing, nonlabored ventilation and respiratory function stable Cardiovascular status: blood pressure returned to baseline and stable Postop Assessment: adequate PO intake Anesthetic complications: no    Darrin Nipper

## 2017-09-17 NOTE — Anesthesia Preprocedure Evaluation (Signed)
Anesthesia Evaluation  Patient identified by MRN, date of birth, ID band Patient awake    Reviewed: Allergy & Precautions, NPO status , Patient's Chart, lab work & pertinent test results  Airway Mallampati: IV  TM Distance: >3 FB Neck ROM: Full    Dental  (+)    Pulmonary former smoker (quit 1983),    Pulmonary exam normal breath sounds clear to auscultation       Cardiovascular Exercise Tolerance: Good hypertension, Normal cardiovascular exam Rhythm:Regular Rate:Normal     Neuro/Psych PSYCHIATRIC DISORDERS Anxiety    GI/Hepatic GERD  ,IBS   Endo/Other  negative endocrine ROS  Renal/GU negative Renal ROS     Musculoskeletal   Abdominal   Peds  Hematology Prostate CA   Anesthesia Other Findings   Reproductive/Obstetrics                             Anesthesia Physical Anesthesia Plan  ASA: II  Anesthesia Plan: General   Post-op Pain Management:    Induction: Intravenous  PONV Risk Score and Plan: 1 and Propofol infusion  Airway Management Planned: Natural Airway  Additional Equipment:   Intra-op Plan:   Post-operative Plan:   Informed Consent: I have reviewed the patients History and Physical, chart, labs and discussed the procedure including the risks, benefits and alternatives for the proposed anesthesia with the patient or authorized representative who has indicated his/her understanding and acceptance.     Plan Discussed with: CRNA  Anesthesia Plan Comments:         Anesthesia Quick Evaluation

## 2017-09-20 ENCOUNTER — Encounter: Payer: Self-pay | Admitting: Gastroenterology

## 2017-09-20 ENCOUNTER — Telehealth: Payer: Self-pay | Admitting: Cardiovascular Disease

## 2017-09-20 NOTE — Telephone Encounter (Signed)
Pt received letter today from [employer] stating McComb (exforge) is being recalled and suggested he contact prescribing physician.  Routed to pharmacy as we have no information on this recall.

## 2017-09-20 NOTE — Telephone Encounter (Signed)
Recall due to formulation with valsartan. Amlodipine has not be recalled.   Would recommend separation and change regimen due to recall - amlodipine 10mg  and lisinopril 40mg  (as separate tablets) once daily for HTN treatment.

## 2017-09-20 NOTE — Telephone Encounter (Signed)
Pt calling stating he is on Amlodipine States he's been on this for about 3 years But now it is on a recall  Would like some advise on this  Please call back

## 2017-09-20 NOTE — Telephone Encounter (Signed)
Routed to Dr. Rockey Situ for medication change approval.

## 2017-09-22 ENCOUNTER — Encounter: Payer: Self-pay | Admitting: Gastroenterology

## 2017-09-22 MED ORDER — AMLODIPINE BESYLATE 10 MG PO TABS: 10.0000 mg | ORAL_TABLET | Freq: Every day | ORAL | 3 refills | Status: DC

## 2017-09-22 MED ORDER — LISINOPRIL 40 MG PO TABS: 40.0000 mg | ORAL_TABLET | Freq: Every day | ORAL | 3 refills | Status: DC

## 2017-09-22 NOTE — Telephone Encounter (Signed)
Medications sent in to patients pharmacy of choice.

## 2017-09-22 NOTE — Telephone Encounter (Signed)
Patient calling to check on response .  States it should not take 48 hrs for a response.  Please call.

## 2017-09-22 NOTE — Telephone Encounter (Signed)
Okay to send in new medications amlodipine and lisinopril

## 2017-09-22 NOTE — Telephone Encounter (Signed)
Spoke with patient and advised that I would check with Dr. Rockey Situ to get confirmation to send in separate prescriptions. He requested they be sent to [facility] when approved. He was appreciative for the call with no further questions at this time.

## 2017-09-24 ENCOUNTER — Other Ambulatory Visit: Payer: Self-pay

## 2017-09-24 MED ORDER — LISINOPRIL 40 MG PO TABS: 40.0000 mg | ORAL_TABLET | Freq: Every day | ORAL | 3 refills | Status: DC

## 2017-10-15 ENCOUNTER — Other Ambulatory Visit: Payer: Self-pay | Admitting: Cardiovascular Disease

## 2017-12-22 ENCOUNTER — Encounter: Payer: Self-pay | Admitting: Family Medicine

## 2017-12-22 ENCOUNTER — Ambulatory Visit: Payer: BLUE CROSS/BLUE SHIELD | Admitting: Family Medicine

## 2017-12-22 VITALS — BP 130/100 | HR 107 | Temp 99.7°F | Resp 16 | Wt 199.6 lb

## 2017-12-22 DIAGNOSIS — J301 Allergic rhinitis due to pollen: Secondary | ICD-10-CM

## 2017-12-22 DIAGNOSIS — S46811A Strain of other muscles, fascia and tendons at shoulder and upper arm level, right arm, initial encounter: Secondary | ICD-10-CM | POA: Diagnosis not present

## 2017-12-22 DIAGNOSIS — J309 Allergic rhinitis, unspecified: Secondary | ICD-10-CM | POA: Insufficient documentation

## 2017-12-22 MED ORDER — CYCLOBENZAPRINE HCL 5 MG PO TABS: 5.0000 mg | ORAL_TABLET | Freq: Every day | ORAL | 0 refills | Status: DC

## 2017-12-22 NOTE — Patient Instructions (Addendum)
May take two Aleve twice daily with food. Avoid putting stress on your neck with the phone or using too many pillow in your bed at night. Try to keep your head, shoulders, and hips aligned when on your side. Call me if not improving for consideration of neck x-ray. Try cromolyn sodium and/or steroid nasal spray like Flonase.

## 2017-12-22 NOTE — Progress Notes (Signed)
Subjective:     Patient ID: Christopher Cervantes, male   DOB: 06-21-1956, 62 y.o.   MRN: [id number] Chief Complaint  Patient presents with  . Back Pain    Patient comes in office to address pinched nerve in his back, patient states that he has been going to massage therapy to help with pain. Patient reports last week when he was in session it was mentioned to him that there appears to be a lump on the left side of his back that was not there previously. Patient denies any pain on the left side  of his back, he states in the morning back pain is worse but gradually improves as he moves throughout the day.    HPI States for the last 2-3 months has had right posterior neck and upper back stiffness which seems to resolve over the course of the day. He has been to the chiropractor ,a masseuse, and taken Aleve occasionally with transient improvement. Denies radiation down his arms. He states this may have triggered by holding the phone with his neck while multi-tasking. Also admits to using weights and 3 pillows in his bed at night. Review of Systems     Objective:   Physical Exam  Constitutional: He appears well-developed and well-nourished. No distress.  Musculoskeletal:  Mild tenderness over right posterior cervical and upper back area which improves with massage. No mass is appreciated on his left side. Cervical FROM without pain. No pain with shoulder flexion > 90 degrees.  Psychiatric:  anxious       Assessment:    1. Trapezius strain, right, initial encounter: rx for cyclobenzaprine 5 mg. At bedtime  2. Seasonal allergic rhinitis due to pollen     Plan:    Avoid triggers for neck strain. May add Aleve. Discussed otc medications for improved allergy control.

## 2018-01-03 ENCOUNTER — Encounter: Payer: Self-pay | Admitting: Family Medicine

## 2018-01-03 ENCOUNTER — Ambulatory Visit: Payer: BLUE CROSS/BLUE SHIELD | Admitting: Family Medicine

## 2018-01-03 VITALS — BP 142/84 | HR 108 | Temp 98.2°F | Resp 14 | Ht 72.0 in | Wt 199.8 lb

## 2018-01-03 DIAGNOSIS — D1721 Benign lipomatous neoplasm of skin and subcutaneous tissue of right arm: Secondary | ICD-10-CM

## 2018-01-03 NOTE — Progress Notes (Signed)
  Subjective:     Patient ID: Christopher Cervantes, male   DOB: Oct 13, 1955, 62 y.o.   MRN: [id number] Chief Complaint  Patient presents with  . knots    2 on right forearm, pt first noticed on sunday   HPI States he was working and noticed the "bump" popping up. He is concerned it may be a blood clot.  Review of Systems     Objective:   Physical Exam  Constitutional: He appears well-developed and well-nourished. No distress.  Cardiovascular:  Right radial and ulnar pulses present; no edema  Skin:  Anterior right forearm with 0.5 cm s.c. Nodule c/w lipoma. No overlying erythema with minimal tenderness.       Assessment:    1. Lipoma of right upper extremity     Plan:    reassurance. May get dermatology second opinion if he desires.

## 2018-01-03 NOTE — Patient Instructions (Signed)
Monitor your arm occasionally. May seek dermatology opinion as well.

## 2018-03-24 ENCOUNTER — Encounter: Payer: Self-pay | Admitting: Family Medicine

## 2018-03-24 ENCOUNTER — Ambulatory Visit: Payer: BLUE CROSS/BLUE SHIELD | Admitting: Family Medicine

## 2018-03-24 VITALS — BP 134/100 | HR 69 | Temp 98.8°F | Resp 16 | Wt 198.0 lb

## 2018-03-24 DIAGNOSIS — B356 Tinea cruris: Secondary | ICD-10-CM

## 2018-03-24 NOTE — Patient Instructions (Signed)
Get clotrimazole cream and apply twice daily for at least two weeks. Also add Tinactin power after bath and in the AM. May mix hydrocortisone with the clotrimazole cream in equal amounts for the first week of treatment.

## 2018-03-24 NOTE — Progress Notes (Signed)
  Subjective:     Patient ID: Christopher Cervantes, male   DOB: 1956/08/06, 62 y.o.   MRN: [id number] Chief Complaint  Patient presents with  . Rash    Patient comes in office today with concerns of rash near his groin that has been present for 2 weeks, patient states that he has been using Naftin Gel and Nystatin powder with no relief.    HPI Recently diagnosed with Tinea cruris per dermatology (Dr. Phillip Heal) but does not feel he is getting better after 10 days of treatment. He plays sports regularly and sweats a lot.  Review of Systems     Objective:   Physical Exam  Constitutional: He appears well-developed and well-nourished. No distress.  Skin:  Bilateral groin area with erythematous patches with numerous satellite lesions noted. No significant scaling today.       Assessment:    1. Tinea cruris: will switch to azole antifungal and see if it works better.    Plan:    Add hydrocortisone cream for the first week and  Tinactin powder after bath and in AM

## 2018-07-01 ENCOUNTER — Other Ambulatory Visit: Payer: Self-pay | Admitting: Cardiovascular Disease

## 2019-11-09 ENCOUNTER — Encounter: Payer: BLUE CROSS/BLUE SHIELD | Admitting: Family Medicine

## 2020-01-21 ENCOUNTER — Emergency Department
Admission: EM | Admit: 2020-01-21 | Discharge: 2020-01-21 | Disposition: A | Discharge status: Home / Self Care | Payer: 59 | Attending: Emergency Medicine | Admitting: Emergency Medicine

## 2020-01-21 ENCOUNTER — Encounter: Payer: Self-pay | Admitting: Emergency Medicine

## 2020-01-21 ENCOUNTER — Other Ambulatory Visit: Payer: Self-pay

## 2020-01-21 DIAGNOSIS — D120 Benign neoplasm of cecum: Secondary | ICD-10-CM | POA: Diagnosis not present

## 2020-01-21 DIAGNOSIS — D123 Benign neoplasm of transverse colon: Secondary | ICD-10-CM | POA: Diagnosis not present

## 2020-01-21 DIAGNOSIS — Z79899 Other long term (current) drug therapy: Secondary | ICD-10-CM | POA: Insufficient documentation

## 2020-01-21 DIAGNOSIS — Z8546 Personal history of malignant neoplasm of prostate: Secondary | ICD-10-CM | POA: Diagnosis not present

## 2020-01-21 DIAGNOSIS — I10 Essential (primary) hypertension: Secondary | ICD-10-CM | POA: Insufficient documentation

## 2020-01-21 DIAGNOSIS — Z87891 Personal history of nicotine dependence: Secondary | ICD-10-CM | POA: Insufficient documentation

## 2020-01-21 LAB — TROPONIN I (HIGH SENSITIVITY)
Troponin I (High Sensitivity): 6 ng/L (ref <18)
Troponin I (High Sensitivity): 6 ng/L (ref <18)

## 2020-01-21 MED ORDER — LOSARTAN POTASSIUM 100 MG PO TABS: 100.0000 mg | ORAL_TABLET | Freq: Every day | ORAL | 1 refills | Status: DC

## 2020-01-21 NOTE — ED Notes (Signed)
First Nurse Note: Pt to ED via POV for HTN. Pt is in NAD at this time.

## 2020-01-21 NOTE — ED Triage Notes (Addendum)
Pt arrived via POV from [facility] with reports of elevated BP since yesterday. Pt c/o HA for the past couple of days and being flushed.   Takes Losartan 50mg  daily, however, took 100mg  Losartan this morning and 50mg  last night because of elevated BP.  Pt states he stopped his BP meds over 1 week ago to try to take a natural supplement.  Pt drinks alcohol daily, states he drinks "more than I should" pt states he last drank yesterday.

## 2020-01-21 NOTE — ED Provider Notes (Signed)
[facility] Emergency Department Provider Note   ____________________________________________    I have reviewed the triage vital signs and the nursing notes.   HISTORY  Chief Complaint Hypertension     HPI Christopher Cervantes is a 64 y.o. male with a history of high blood pressure presents with complaints of hypertension. Patient reports that he went to urgent care today because of elevated blood pressure. He reports 1 week ago he stopped taking his blood pressure medication and try to take a natural supplement instead. He also admits to drinking alcohol daily and does have withdrawal symptoms if he does not drink. Denies chest pain or shortness of breath. No edema. Reportedly lab work at outside facility was unremarkable. Sent here for further evaluation.  Past Medical History:  Diagnosis Date  . Dental bridge present    top right  . GERD (gastroesophageal reflux disease)   . HLD (hyperlipidemia)   . HTN (hypertension)   . IBS (irritable bowel syndrome)     Patient Active Problem List   Diagnosis Date Noted  . Allergic rhinitis 12/22/2017  . History of colonic polyps   . Benign neoplasm of cecum   . Benign neoplasm of transverse colon   . Atopic dermatitis 01/01/2017  . Chronic anxiety 11/12/2015  . Benign prostatic hyperplasia with urinary obstruction 09/05/2012  . CA in situ prostate 09/05/2012  . Chronic prostatitis 09/05/2012  . ED (erectile dysfunction) of organic origin 09/05/2012  . Hyperlipidemia 01/06/2010  . Benign essential HTN 01/06/2010    Past Surgical History:  Procedure Laterality Date  . COLONOSCOPY WITH PROPOFOL N/A 09/17/2017   Procedure: COLONOSCOPY WITH PROPOFOL;  Surgeon: Lucilla Lame, MD;  Location: [place];  Service: Endoscopy;  Laterality: N/A;  . POLYPECTOMY  09/17/2017   Procedure: POLYPECTOMY INTESTINAL;  Surgeon: Lucilla Lame, MD;  Location: [place];  Service: Endoscopy;;  . TONSILLECTOMY     . VASECTOMY    . WISDOM TOOTH EXTRACTION      Prior to Admission medications   Medication Sig Start Date End Date Taking? Authorizing Provider  betamethasone valerate (VALISONE) 0.1 % cream APPLY TO THE AFFECTED AREA OF ECZEMA AS NEEDED FOR FLARES TWICE DAILY 07/27/17   [provider]  CALCIUM-VITAMIN D PO Take 600 mg by mouth daily.    [provider]  cetirizine (ZYRTEC) 10 MG tablet TK 1 T PO QHS PRN 12/06/17   [provider]  Coenzyme Q10 (COQ10 PO) Take by mouth.    [provider]  Cyanocobalamin (VITAMIN B-12 PO) Take by mouth.    [provider]  cyclobenzaprine (FLEXERIL) 5 MG tablet Take 1 tablet (5 mg total) by mouth at bedtime. 12/22/17   Carmon Ginsberg, [place], Misc. (LEVICYN) GEL APPLY TO THE AFFECTED AREA EVERY DAY 12/30/16   [provider]  esomeprazole (NEXIUM) 10 MG packet Take 10 mg by mouth daily before breakfast.    [provider]  halobetasol (ULTRAVATE) 0.05 % ointment Reported on 11/19/2015 03/04/15   [provider]  losartan (COZAAR) 100 MG tablet Take 1 tablet (100 mg total) by mouth daily. 01/21/20   Lavonia Drafts, MD  mometasone (ELOCON) 0.1 % cream APP EXT AA BID FOR 3 DAYS 05/18/17   [provider]  NAFTIN 2 % GEL  03/21/18   [provider]  NYSTATIN powder Apply topically 2 (two) times daily. to affected area 03/15/18   [provider]  Omega-3 Fatty Acids ([place]  PO) Take by mouth.    [provider]  Probiotic Product (SOLUBLE FIBER/PROBIOTICS PO) Take by mouth daily.    [provider]  Red Yeast Rice 600 MG CAPS Take 2 capsules by mouth every other day. Reported on 11/19/2015    [provider]  rosuvastatin (CRESTOR) 5 MG tablet Take 1 tablet (5 mg total) by mouth daily. 03/12/17   Minna Merritts, MD  terbinafine (LAMISIL) 250 MG tablet TK 1 T PO QD 03/15/18   [provider]  lisinopril  (PRINIVIL,ZESTRIL) 40 MG tablet TAKE 1 TABLET(40 MG) BY MOUTH DAILY 07/01/18 01/21/20  Minna Merritts, MD     Allergies Lactose, Lactose intolerance (gi), Shellfish allergy, and Wheat bran  Family History  Problem Relation Age of Onset  . Hypertension Mother   . Hyperlipidemia Mother   . Cancer Father   . Hyperlipidemia Sister   . Hypertension Sister   . Hyperlipidemia Brother   . Hypertension Brother     Social History Social History   Tobacco Use  . Smoking status: Former Smoker    Quit date: 12/21/1981    Years since quitting: 38.1  . Smokeless tobacco: Never Used  Substance Use Topics  . Alcohol use: Yes    Alcohol/week: 2.0 standard drinks    Types: 2 Glasses of wine per week    Comment:    . Drug use: No    Review of Systems  Constitutional: No fever/chills Eyes: No visual changes.  ENT: No sore throat. Cardiovascular: Denies chest pain. Respiratory: Denies shortness of breath. Gastrointestinal: No abdominal pain.   Genitourinary: Negative for dysuria. Musculoskeletal: Negative for back pain. Skin: Negative for rash. Neurological: Negative for headaches    ____________________________________________   PHYSICAL EXAM:  VITAL SIGNS: ED Triage Vitals  Enc Vitals Group     BP 01/21/20 1502 (!) 167/97     Pulse Rate 01/21/20 1502 (!) 117     Resp 01/21/20 1502 20     Temp 01/21/20 1502 98.4 F (36.9 C)     Temp Source 01/21/20 1502 Oral     SpO2 01/21/20 1502 94 %     Weight 01/21/20 1503 90.7 kg (200 lb)     Height 01/21/20 1503 1.829 m (6')     Head Circumference --      Peak Flow --      Pain Score 01/21/20 1502 8     Pain Loc --      Pain Edu? --      Excl. in [place]? --     Constitutional: Alert and oriented.   Nose: No congestion/rhinnorhea. Mouth/Throat: Mucous membranes are moist.    Cardiovascular: Normal rate, regular rhythm. Grossly normal heart sounds.  Good peripheral circulation. Respiratory: Normal respiratory effort.  No  retractions. Lungs CTAB. Gastrointestinal: Soft and nontender. No distention..  Musculoskeletal: No lower extremity tenderness nor edema.  Warm and well perfused Neurologic:  Normal speech and language. No gross focal neurologic deficits are appreciated.  Skin:  Skin is warm, dry and intact. No rash noted. Psychiatric: Mood and affect are normal. Speech and behavior are normal.  ____________________________________________   LABS (all labs ordered are listed, but only abnormal results are displayed)  Labs Reviewed  TROPONIN I (HIGH SENSITIVITY)  TROPONIN I (HIGH SENSITIVITY)   ____________________________________________  EKG  ED ECG REPORT I, Lavonia Drafts, the attending physician, personally viewed and interpreted this ECG.  Date: 01/21/2020  Rhythm: normal sinus rhythm QRS Axis: normal Intervals: normal ST/T Wave  abnormalities: normal Narrative Interpretation: no evidence of acute ischemia  ____________________________________________  RADIOLOGY  None ____________________________________________   PROCEDURES  Procedure(s) performed: No  Procedures   Critical Care performed: No ____________________________________________   INITIAL IMPRESSION / ASSESSMENT AND PLAN / ED COURSE  Pertinent labs & imaging results that were available during my care of the patient were reviewed by me and considered in my medical decision making (see chart for details).  Patient well-appearing and in no acute distress. Patient is mildly hypertensive here otherwise well-appearing. Asymptomatic at this time. Discussed with the patient that we should put him back on his losartan and he agrees, he will stop taking his supplement as it is clearly not working for him. Also discussed with him that he needs to detox from alcohol, he states that he plans to do this in the future.    ____________________________________________   FINAL CLINICAL IMPRESSION(S) / ED DIAGNOSES  Final  diagnoses:  Essential hypertension        Note:  This document was prepared using Dragon voice recognition software and may include unintentional dictation errors.   Lavonia Drafts, MD 01/21/20 2002

## 2020-01-24 NOTE — Progress Notes (Deleted)
Complete physical exam   Patient: Christopher Cervantes   DOB: [birth year]   64 y.o. Male  MRN: [id number] Visit Date: 01/30/2020  Today's healthcare provider: Wilhemena Durie, MD   No chief complaint on file.  Subjective    Christopher Cervantes is a 64 y.o. male who presents today for a complete physical exam.  He reports consuming a {diet types:17450} diet. {Exercise:19826} He generally feels {well/fairly well/poorly:18703}. He reports sleeping {well/fairly well/poorly:18703}. He {does/does not:200015} have additional problems to discuss today.  HPI  ***  Past Medical History:  Diagnosis Date  . Dental bridge present    top right  . GERD (gastroesophageal reflux disease)   . HLD (hyperlipidemia)   . HTN (hypertension)   . IBS (irritable bowel syndrome)    Past Surgical History:  Procedure Laterality Date  . COLONOSCOPY WITH PROPOFOL N/A 09/17/2017   Procedure: COLONOSCOPY WITH PROPOFOL;  Surgeon: Lucilla Lame, MD;  Location: [place];  Service: Endoscopy;  Laterality: N/A;  . POLYPECTOMY  09/17/2017   Procedure: POLYPECTOMY INTESTINAL;  Surgeon: Lucilla Lame, MD;  Location: [place];  Service: Endoscopy;;  . TONSILLECTOMY    . VASECTOMY    . WISDOM TOOTH EXTRACTION     Social History   Socioeconomic History  . Marital status: Married    Spouse name: Not on file  . Number of children: Not on file  . Years of education: Not on file  . Highest education level: Not on file  Occupational History  . Not on file  Tobacco Use  . Smoking status: Former Smoker    Quit date: 12/21/1981    Years since quitting: 38.1  . Smokeless tobacco: Never Used  Substance and Sexual Activity  . Alcohol use: Yes    Alcohol/week: 2.0 standard drinks    Types: 2 Glasses of wine per week    Comment:    . Drug use: No  . Sexual activity: Not on file  Other Topics Concern  . Not on file  Social History Narrative  . Not on file   Social Determinants of Health    Financial Resource Strain:   . Difficulty of Paying Living Expenses:   Food Insecurity:   . Worried About Charity fundraiser in the Last Year:   . Arboriculturist in the Last Year:   Transportation Needs:   . Film/video editor (Medical):   Marland Kitchen Lack of Transportation (Non-Medical):   Physical Activity:   . Days of Exercise per Week:   . Minutes of Exercise per Session:   Stress:   . Feeling of Stress :   Social Connections:   . Frequency of Communication with Friends and Family:   . Frequency of Social Gatherings with Friends and Family:   . Attends Religious Services:   . Active Member of Clubs or Organizations:   . Attends Archivist Meetings:   Marland Kitchen Marital Status:   Intimate Partner Violence:   . Fear of Current or Ex-Partner:   . Emotionally Abused:   Marland Kitchen Physically Abused:   . Sexually Abused:    Family Status  Relation Name Status  . Mother  Alive  . Father  Deceased at age 71  . Sister  Alive  . Brother  Alive   Family History  Problem Relation Age of Onset  . Hypertension Mother   . Hyperlipidemia Mother   . Cancer Father   . Hyperlipidemia Sister   . Hypertension  Sister   . Hyperlipidemia Brother   . Hypertension Brother    Allergies  Allergen Reactions  . Lactose     "leaky gut"  . Lactose Intolerance (Gi)     "leaky gut"  . Shellfish Allergy     Stomach issues Stomach issues  . Wheat Bran     "leaky gut"    Patient Care Team: Jerrol Banana., MD as PCP - General (Unknown Physician Specialty) Minna Merritts, MD as Consulting Physician (Cardiology) Carmon Ginsberg, [place] as Referring Physician (Family Medicine) Robert Bellow, MD (General Surgery)   Medications: Outpatient Medications Prior to Visit  Medication Sig  . betamethasone valerate (VALISONE) 0.1 % cream APPLY TO THE AFFECTED AREA OF ECZEMA AS NEEDED FOR FLARES TWICE DAILY  . CALCIUM-VITAMIN D PO Take 600 mg by mouth daily.  . cetirizine (ZYRTEC) 10 MG tablet  TK 1 T PO QHS PRN  . Coenzyme Q10 (COQ10 PO) Take by mouth.  . Cyanocobalamin (VITAMIN B-12 PO) Take by mouth.  . cyclobenzaprine (FLEXERIL) 5 MG tablet Take 1 tablet (5 mg total) by mouth at bedtime.  . Dermatological Products, Misc. (LEVICYN) GEL APPLY TO THE AFFECTED AREA EVERY DAY  . esomeprazole (NEXIUM) 10 MG packet Take 10 mg by mouth daily before breakfast.  . halobetasol (ULTRAVATE) 0.05 % ointment Reported on 11/19/2015  . losartan (COZAAR) 100 MG tablet Take 1 tablet (100 mg total) by mouth daily.  . mometasone (ELOCON) 0.1 % cream APP EXT AA BID FOR 3 DAYS  . NAFTIN 2 % GEL   . NYSTATIN powder Apply topically 2 (two) times daily. to affected area  . Omega-3 Fatty Acids (FISH OIL PO) Take by mouth.  . Probiotic Product (SOLUBLE FIBER/PROBIOTICS PO) Take by mouth daily.  . Red Yeast Rice 600 MG CAPS Take 2 capsules by mouth every other day. Reported on 11/19/2015  . rosuvastatin (CRESTOR) 5 MG tablet Take 1 tablet (5 mg total) by mouth daily.  Marland Kitchen terbinafine (LAMISIL) 250 MG tablet TK 1 T PO QD   No facility-administered medications prior to visit.    Review of Systems  {Show previous labs (optional):23779::" "}  Objective    There were no vitals taken for this visit. {Show previous vital signs (optional):23777::" "}  Physical Exam  ***  Depression Screen  PHQ 2/9 Scores 01/03/2018 01/04/2017  PHQ - 2 Score 0 2  PHQ- 9 Score - 8    No results found for any visits on 01/30/20.  Assessment & Plan    Routine Health Maintenance and Physical Exam  Exercise Activities and Dietary recommendations Goals   None     Immunization History  Administered Date(s) Administered  . Tdap 03/08/2009    Health Maintenance  Topic Date Due  . HIV Screening  Never done  . COVID-19 Vaccine (1) Never done  . TETANUS/TDAP  03/09/2019  . INFLUENZA VACCINE  04/07/2020  . COLONOSCOPY  09/17/2022  . Hepatitis C Screening  Completed    Discussed health benefits of physical  activity, and encouraged him to engage in regular exercise appropriate for his age and condition.  ***  No follow-ups on file.     {provider attestation***:1}   Wilhemena Durie, MD  [facility] [phone] (phone) [phone] (fax)  [place]

## 2020-01-30 ENCOUNTER — Encounter: Payer: Self-pay | Admitting: Family Medicine

## 2020-02-07 ENCOUNTER — Encounter: Payer: Self-pay | Admitting: Family Medicine

## 2020-02-07 NOTE — Progress Notes (Signed)
Complete physical exam  I,April Miller,acting as a scribe for Wilhemena Durie, MD.,have documented all relevant documentation on the behalf of Wilhemena Durie, MD,as directed by  Wilhemena Durie, MD while in the presence of Wilhemena Durie, MD.   Patient: Christopher Cervantes   DOB: 16-Feb-1956   63 y.o. Male  MRN: [id number] Visit Date: 02/08/2020  Today's healthcare provider: Wilhemena Durie, MD   Chief Complaint  Patient presents with  . Annual Exam   Subjective    Christopher Cervantes is a 64 y.o. male who presents today for a complete physical exam.  He is married for 35 years.  He is a Forensic psychologist.  He has one 22 year old daughter.  He walks daily on elliptical.  He does drink alcohol daily and intends to cut back. He reports consuming a general diet. Home exercise routine includes walking. He generally feels fairly well. He reports sleeping fairly well. He does not have additional problems to discuss today.  HPI    Past Medical History:  Diagnosis Date  . Dental bridge present    top right  . GERD (gastroesophageal reflux disease)   . HLD (hyperlipidemia)   . HTN (hypertension)   . IBS (irritable bowel syndrome)    Past Surgical History:  Procedure Laterality Date  . COLONOSCOPY WITH PROPOFOL N/A 09/17/2017   Procedure: COLONOSCOPY WITH PROPOFOL;  Surgeon: Lucilla Lame, MD;  Location: [facility];  Service: Endoscopy;  Laterality: N/A;  . POLYPECTOMY  09/17/2017   Procedure: POLYPECTOMY INTESTINAL;  Surgeon: Lucilla Lame, MD;  Location: [place];  Service: Endoscopy;;  . TONSILLECTOMY    . VASECTOMY    . WISDOM TOOTH EXTRACTION     Social History   Socioeconomic History  . Marital status: Married    Spouse name: Not on file  . Number of children: Not on file  . Years of education: Not on file  . Highest education level: Not on file  Occupational History  . Not on file  Tobacco Use  . Smoking status: Former Smoker    Quit date:  12/21/1981    Years since quitting: 38.1  . Smokeless tobacco: Never Used  Substance and Sexual Activity  . Alcohol use: Yes    Alcohol/week: 2.0 standard drinks    Types: 2 Glasses of wine per week    Comment:    . Drug use: No  . Sexual activity: Not on file  Other Topics Concern  . Not on file  Social History Narrative  . Not on file   Social Determinants of Health   Financial Resource Strain:   . Difficulty of Paying Living Expenses:   Food Insecurity:   . Worried About Charity fundraiser in the Last Year:   . Arboriculturist in the Last Year:   Transportation Needs:   . Film/video editor (Medical):   Marland Kitchen Lack of Transportation (Non-Medical):   Physical Activity:   . Days of Exercise per Week:   . Minutes of Exercise per Session:   Stress:   . Feeling of Stress :   Social Connections:   . Frequency of Communication with Friends and Family:   . Frequency of Social Gatherings with Friends and Family:   . Attends Religious Services:   . Active Member of Clubs or Organizations:   . Attends Archivist Meetings:   Marland Kitchen Marital Status:   Intimate Partner Violence:   . Fear  of Current or Ex-Partner:   . Emotionally Abused:   Marland Kitchen Physically Abused:   . Sexually Abused:    Family Status  Relation Name Status  . Mother  Alive  . Father  Deceased at age 107  . Sister  Alive  . Brother  Alive   Family History  Problem Relation Age of Onset  . Hypertension Mother   . Hyperlipidemia Mother   . Cancer Father   . Hyperlipidemia Sister   . Hypertension Sister   . Hyperlipidemia Brother   . Hypertension Brother    Allergies  Allergen Reactions  . Lactose     "leaky gut"  . Lactose Intolerance (Gi)     "leaky gut"  . Shellfish Allergy     Stomach issues Stomach issues  . Wheat Bran     "leaky gut"    Patient Care Team: Jerrol Banana., MD as PCP - General (Unknown Physician Specialty) Minna Merritts, MD as Consulting Physician  (Cardiology) Carmon Ginsberg, [place] as Referring Physician (Family Medicine) Robert Bellow, MD (General Surgery)   Medications: Outpatient Medications Prior to Visit  Medication Sig  . betamethasone valerate (VALISONE) 0.1 % cream APPLY TO THE AFFECTED AREA OF ECZEMA AS NEEDED FOR FLARES TWICE DAILY  . CALCIUM-VITAMIN D PO Take 600 mg by mouth daily.  . Cholecalciferol (VITAMIN D3 PO) Take by mouth.  . Cyanocobalamin (VITAMIN B-12 PO) Take by mouth.  . cyclobenzaprine (FLEXERIL) 5 MG tablet Take 1 tablet (5 mg total) by mouth at bedtime.  . halobetasol (ULTRAVATE) 0.05 % ointment Reported on 11/19/2015  . losartan (COZAAR) 100 MG tablet Take 1 tablet (100 mg total) by mouth daily.  . Multiple Vitamins-Minerals (ZINC PO) Take by mouth.  . NYSTATIN powder Apply topically 2 (two) times daily. to affected area  . Omega-3 Fatty Acids (FISH OIL PO) Take by mouth.  . Probiotic Product (SOLUBLE FIBER/PROBIOTICS PO) Take by mouth daily.  . Red Yeast Rice 600 MG CAPS Take 2 capsules by mouth every other day. Reported on 11/19/2015  . rosuvastatin (CRESTOR) 5 MG tablet Take 1 tablet (5 mg total) by mouth daily.  . cetirizine (ZYRTEC) 10 MG tablet TK 1 T PO QHS PRN  . Coenzyme Q10 (COQ10 PO) Take by mouth.  . Dermatological Products, Misc. (LEVICYN) GEL APPLY TO THE AFFECTED AREA EVERY DAY  . esomeprazole (NEXIUM) 10 MG packet Take 10 mg by mouth daily before breakfast.  . mometasone (ELOCON) 0.1 % cream APP EXT AA BID FOR 3 DAYS  . NAFTIN 2 % GEL   . terbinafine (LAMISIL) 250 MG tablet TK 1 T PO QD   No facility-administered medications prior to visit.    Review of Systems  Skin: Positive for rash.  Allergic/Immunologic: Positive for environmental allergies.  All other systems reviewed and are negative.     Objective    BP 137/86 (BP Location: Right Arm, Patient Position: Sitting, Cuff Size: Large)   Pulse 94   Temp (!) 97.3 F (36.3 C) (Other (Comment))   Resp 16   Ht 6' (1.829 m)    Wt 208 lb (94.3 kg)   SpO2 94%   BMI 28.21 kg/m    Physical Exam Vitals and nursing note reviewed. Exam conducted with a chaperone present.  Constitutional:      Appearance: Normal appearance. He is normal weight.  HENT:     Right Ear: Tympanic membrane normal.     Left Ear: Tympanic membrane normal.  Nose: Nose normal.     Mouth/Throat:     Mouth: Mucous membranes are moist.  Cardiovascular:     Rate and Rhythm: Normal rate and regular rhythm.     Pulses: Normal pulses.     Heart sounds: Normal heart sounds.  Pulmonary:     Effort: Pulmonary effort is normal.     Breath sounds: Normal breath sounds.  Abdominal:     General: Bowel sounds are normal.     Palpations: Abdomen is soft.  Genitourinary:    Penis: Normal.      Testes: Normal.     Prostate: Normal.     Rectum: Normal.  Musculoskeletal:     Cervical back: Normal range of motion and neck supple.     Right lower leg: No edema.     Left lower leg: No edema.  Skin:    General: Skin is warm.  Neurological:     General: No focal deficit present.     Mental Status: He is alert and oriented to person, place, and time.  Psychiatric:        Mood and Affect: Mood normal.        Behavior: Behavior normal.        Thought Content: Thought content normal.        Judgment: Judgment normal.       Depression Screen  PHQ 2/9 Scores 02/08/2020 01/03/2018 01/04/2017  PHQ - 2 Score 0 0 2  PHQ- 9 Score 0 - 8    Results for orders placed or performed in visit on 02/08/20  IFOBT POC (occult bld, rslt in office)  Result Value Ref Range   IFOBT Negative     Assessment & Plan    Routine Health Maintenance and Physical Exam  Exercise Activities and Dietary recommendations Goals   None     Immunization History  Administered Date(s) Administered  . Td 02/08/2020  . Tdap 03/08/2009    Health Maintenance  Topic Date Due  . COVID-19 Vaccine (1) Never done  . HIV Screening  Never done  . INFLUENZA VACCINE   04/07/2020  . COLONOSCOPY  09/17/2022  . TETANUS/TDAP  02/07/2030  . Hepatitis C Screening  Completed    Discussed health benefits of physical activity, and encouraged him to engage in regular exercise appropriate for his age and condition.  1. Annual physical exam Update tetanus shot. - Lipid panel - TSH - CBC w/Diff/Platelet - Comprehensive Metabolic Panel (CMET) - PSA - POCT urinalysis dipstick--occasional white cell - IFOBT POC (occult bld, rslt in office)-normal  2. Benign essential HTN  - Lipid panel - TSH - CBC w/Diff/Platelet - Comprehensive Metabolic Panel (CMET)  3. Mixed hyperlipidemia  - Lipid panel - TSH - CBC w/Diff/Platelet - Comprehensive Metabolic Panel (CMET)  4. Screening for prostate cancer  - Lipid panel - TSH - CBC w/Diff/Platelet - Comprehensive Metabolic Panel (CMET) - PSA  5. Encounter for screening fecal occult blood testing  - IFOBT POC (occult bld, rslt in office)-normal  6. Need for tetanus booster  - Td : Tetanus/diphtheria >7yo Preservative  free 7.  Eczema Followed by dermatology.  Return in 2 months (on 04/10/2020).     I, Wilhemena Durie, MD, have reviewed all documentation for this visit. The documentation on 02/12/20 for the exam, diagnosis, procedures, and orders are all accurate and complete.    Tierre Gerard Cranford Mon, MD  [facility] [phone] (phone) [phone] (fax)  [place]

## 2020-02-08 ENCOUNTER — Ambulatory Visit (INDEPENDENT_AMBULATORY_CARE_PROVIDER_SITE_OTHER): Payer: 59 | Admitting: Family Medicine

## 2020-02-08 ENCOUNTER — Other Ambulatory Visit: Payer: Self-pay

## 2020-02-08 ENCOUNTER — Encounter: Payer: Self-pay | Admitting: Family Medicine

## 2020-02-08 VITALS — BP 137/86 | HR 94 | Temp 97.3°F | Resp 16 | Ht 72.0 in | Wt 208.0 lb

## 2020-02-08 DIAGNOSIS — I10 Essential (primary) hypertension: Secondary | ICD-10-CM | POA: Diagnosis not present

## 2020-02-08 DIAGNOSIS — Z1211 Encounter for screening for malignant neoplasm of colon: Secondary | ICD-10-CM | POA: Diagnosis not present

## 2020-02-08 DIAGNOSIS — Z23 Encounter for immunization: Secondary | ICD-10-CM | POA: Diagnosis not present

## 2020-02-08 DIAGNOSIS — Z125 Encounter for screening for malignant neoplasm of prostate: Secondary | ICD-10-CM | POA: Diagnosis not present

## 2020-02-08 DIAGNOSIS — E782 Mixed hyperlipidemia: Secondary | ICD-10-CM

## 2020-02-08 DIAGNOSIS — Z0000 Encounter for general adult medical examination without abnormal findings: Secondary | ICD-10-CM

## 2020-02-08 LAB — POCT URINALYSIS DIPSTICK
Appearance: NORMAL
Bilirubin, UA: NEGATIVE
Blood, UA: NEGATIVE
Glucose, UA: NEGATIVE
Nitrite, UA: NEGATIVE
Odor: NORMAL
Protein, UA: POSITIVE — AB
Spec Grav, UA: 1.02 (ref 1.010–1.025)
Urobilinogen, UA: 2 E.U./dL — AB
pH, UA: 6 (ref 5.0–8.0)

## 2020-02-08 LAB — IFOBT (OCCULT BLOOD): IFOBT: NEGATIVE

## 2020-02-13 ENCOUNTER — Ambulatory Visit: Payer: Self-pay | Admitting: Family Medicine

## 2020-03-24 NOTE — Progress Notes (Deleted)
Cardiology Office Note  Date:  03/24/2020   ID:  Christopher Cervantes, Christopher Cervantes [birth year], MRN [id number]  PCP:  Jerrol Banana., MD   No chief complaint on file.   HPI:  Christopher Cervantes is a 64 year-old gentleman with past medical history of  hypertension,  hyperlipidemia,  irritable bowel syndrome  prostate issues,  palpitations,  Alcohol abuse who presents for routine followup of his hypertension and cholesterol  Last seen by myself in clinic July 6th  2018 Follows with family medicine at [facility] Seen in the urgent care and emergency room May 2021 for hypertension He had stopped his blood pressure medication, was taking a supplement Drinking alcohol on a regular basis, has withdrawal symptoms if he does not drink Blood pressure in the emergency room 167/97  Transition back to Dr. Rosanna Randy, seen February 08, 2020 He does drink alcohol daily and intends to cut back. 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 Reports he went 6 miles today Denies any shortness of breath or chest pain  He is taking one half blood pressure pill amlodipine valsartan  Reports excellent control of his blood pressure Cholesterol is up to 290, taking less red yeast rice Never tried a statin but was worried about side effects  EKG on today's visit shows normal sinus rhythm with rate 66 bpm, no significant ST or T-wave changes  Other past medical history  no significant family history of coronary artery disease.  PMH:   has a past medical history of Dental bridge present, GERD (gastroesophageal reflux disease), HLD (hyperlipidemia), HTN (hypertension), and IBS (irritable bowel syndrome).  PSH:    Past Surgical History:  Procedure  Laterality Date  . COLONOSCOPY WITH PROPOFOL N/A 09/17/2017   Procedure: COLONOSCOPY WITH PROPOFOL;  Surgeon: Lucilla Lame, MD;  Location: [facility];  Service: Endoscopy;  Laterality: N/A;  . POLYPECTOMY  09/17/2017   Procedure: POLYPECTOMY INTESTINAL;  Surgeon: Lucilla Lame, MD;  Location: [facility];  Service: Endoscopy;;  . TONSILLECTOMY    . VASECTOMY    . WISDOM TOOTH EXTRACTION      Current Outpatient Prescriptions  Medication Sig Dispense Refill  . ALPRAZolam (XANAX) 0.5 MG tablet Take 0.25 mg by mouth at bedtime as needed. Reported on 11/19/2015    . amLODipine-valsartan (EXFORGE) 10-320 MG tablet take 1/2 tablet by mouth once daily 30 tablet 3  . CALCIUM-VITAMIN D PO Take 600 mg by mouth daily.    . Coenzyme Q10 (COQ10 PO) Take by mouth.    . Cyanocobalamin (VITAMIN B-12 PO) Take by mouth.    . Dermatological Products, Misc. (LEVICYN) GEL APPLY TO THE AFFECTED AREA EVERY DAY  0  . esomeprazole (NEXIUM) 10 MG packet Take 10 mg by mouth daily before breakfast.    . finasteride (PROSCAR) 5 MG tablet Take 5 mg by mouth as needed.     . halobetasol (ULTRAVATE) 0.05 % ointment Reported on 11/19/2015  0  . Omega-3 Fatty Acids (FISH OIL PO) Take by mouth.    . Probiotic Product (SOLUBLE FIBER/PROBIOTICS PO) Take by mouth daily.    . Red Yeast Rice 600 MG CAPS Take 2 capsules by mouth every other day. Reported on  11/19/2015     No current facility-administered medications for this visit.      Allergies:   Lactose, Lactose intolerance (gi), Shellfish allergy, and Wheat bran   Social History:  The patient  reports that he quit smoking about 38 years ago. He has never used smokeless tobacco. He reports current alcohol use of about 2.0 standard drinks of alcohol per week. He reports that he does not use drugs.   Family History:   family history includes Cancer in his father; Hyperlipidemia in his brother, mother, and sister; Hypertension in his brother, mother, and sister.     Review of Systems: Review of Systems  Constitutional: Negative.   Respiratory: Negative.   Cardiovascular: Negative.   Gastrointestinal: Negative.   Musculoskeletal: Negative.   Neurological: Negative.   Psychiatric/Behavioral: Negative.   All other systems reviewed and are negative.    PHYSICAL EXAM: VS:  There were no vitals taken for this visit. , BMI There is no height or weight on file to calculate BMI. GEN: Well nourished, well developed, in no acute distress  HEENT: normal  Neck: no JVD, carotid bruits, or masses Cardiac: RRR; no murmurs, rubs, or gallops,no edema  Respiratory:  clear to auscultation bilaterally, normal work of breathing GI: soft, nontender, nondistended, + BS MS: no deformity or atrophy  Skin: warm and dry, no rash Neuro:  Strength and sensation are intact Psych: euthymic mood, full affect    Recent Labs: No results found for requested labs within last 8760 hours.    Lipid Panel Lab Results  Component Value Date   CHOL 292 (H) 01/04/2017   HDL 130 01/04/2017   LDLCALC 147 (H) 01/04/2017   TRIG 76 01/04/2017      Wt Readings from Last 3 Encounters:  02/08/20 208 lb (94.3 kg)  01/21/20 200 lb (90.7 kg)  03/24/18 198 lb (89.8 kg)       ASSESSMENT AND PLAN:  Benign essential HTN - Plan: EKG 12-Lead Blood pressure is well controlled on today's visit. No changes made to the medications.  Mixed hyperlipidemia - Plan: EKG 12-Lead Long discussion concerning management of his cholesterol He is not having much success with red yeast rice Suggested he start Crestor 5 mg daily Discussed risk and benefit, possible side effects. 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    Signed, Esmond Plants, M.D., Ph.D. 03/24/2020  [place], [place]

## 2020-03-25 ENCOUNTER — Ambulatory Visit: Payer: 59 | Admitting: Cardiovascular Disease

## 2020-04-03 ENCOUNTER — Ambulatory Visit: Payer: 59 | Admitting: Physician Assistant

## 2020-04-10 ENCOUNTER — Ambulatory Visit: Payer: Self-pay | Admitting: Family Medicine

## 2020-04-13 NOTE — Progress Notes (Signed)
Cardiology Office Note  Date:  04/15/2020   ID:  Christopher Cervantes, Christopher Cervantes 11-12-1955, MRN [id number]  PCP:  Jerrol Banana., MD   Cc:   HPI:  Christopher Cervantes is a 64 year-old gentleman with past medical history of  hypertension,  hyperlipidemia,  irritable bowel syndrome  prostate issues,  palpitations,  Alcohol abuse who presents for evaluation of his hypertension and hyperlipidemia  Last seen by myself in clinic July 6th  2018 Follows with family medicine at [facility] Seen in the urgent care and emergency room May 2021 for hypertension He had stopped his blood pressure medication, was taking a supplement Drinking alcohol on a regular basis, "has withdrawal symptoms if he does not drink" Blood pressure in the emergency room 167/97  Transitioned back to Dr. 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  PMH:   has a past medical history of Dental bridge present, GERD (gastroesophageal reflux disease), HLD (hyperlipidemia), HTN  (hypertension), and IBS (irritable bowel syndrome).  PSH:    Past Surgical History:  Procedure Laterality Date  . COLONOSCOPY WITH PROPOFOL N/A 09/17/2017   Procedure: COLONOSCOPY WITH PROPOFOL;  Surgeon: Lucilla Lame, MD;  Location: [place];  Service: Endoscopy;  Laterality: N/A;  . POLYPECTOMY  09/17/2017   Procedure: POLYPECTOMY INTESTINAL;  Surgeon: Lucilla Lame, MD;  Location: [place];  Service: Endoscopy;;  . TONSILLECTOMY    . VASECTOMY    . WISDOM TOOTH EXTRACTION      Current Outpatient Medications on File Prior to Visit  Medication Sig Dispense Refill  . CALCIUM-VITAMIN D PO Take 600 mg by mouth daily.    . Cholecalciferol (VITAMIN D3 PO) Take by mouth.    . Coenzyme Q10 (COQ10 PO) Take by mouth.    . Cyanocobalamin (VITAMIN B-12 PO) Take by mouth.    . esomeprazole (NEXIUM) 10 MG packet Take 10 mg by mouth daily before breakfast.    . finasteride (PROSCAR) 5 MG tablet Take 5 mg by mouth daily.    Marland Kitchen losartan (COZAAR) 100 MG tablet Take 1 tablet (100 mg total) by mouth daily. 30 tablet 1  . Omega-3 Fatty Acids (FISH OIL PO) Take by mouth.    . Probiotic Product (SOLUBLE FIBER/PROBIOTICS PO) Take by mouth daily.    . Red Yeast Rice 600 MG CAPS Take 2 capsules by mouth every other day. Reported on 11/19/2015    . Zinc 50 MG CAPS Take 50 mg by mouth daily.    . [  DISCONTINUED] lisinopril (PRINIVIL,ZESTRIL) 40 MG tablet TAKE 1 TABLET(40 MG) BY MOUTH DAILY 30 tablet 0   No current facility-administered medications on file prior to visit.     Allergies:   Lactose, Lactose intolerance (gi), Shellfish allergy, and Wheat bran   Social History:  The patient  reports that he quit smoking about 38 years ago. He has never used smokeless tobacco. He reports current alcohol use of about 2.0 standard drinks of alcohol per week. He reports that he does not use drugs.   Family History:   family history includes Cancer in his father; Hyperlipidemia in his brother,  mother, and sister; Hypertension in his brother, mother, and sister.    Review of Systems: Review of Systems  Constitutional: Negative.   Respiratory: Negative.   Cardiovascular: Negative.   Gastrointestinal: Negative.   Musculoskeletal: Negative.   Neurological: Negative.   Psychiatric/Behavioral: Negative.   All other systems reviewed and are negative.   PHYSICAL EXAM: VS:  BP (!) 160/70 (BP Location: Left Arm, Patient Position: Sitting, Cuff Size: Normal)   Pulse 80   Ht 6' (1.829 m)   Wt 208 lb (94.3 kg)   SpO2 96%   BMI 28.21 kg/m  , BMI Body mass index is 28.21 kg/m. Constitutional:  oriented to person, place, and time. No distress.  HENT:  Head: Grossly normal Eyes:  no discharge. No scleral icterus.  Neck: No JVD, no carotid bruits  Cardiovascular: Regular rate and rhythm, no murmurs appreciated Pulmonary/Chest: Clear to auscultation bilaterally, no wheezes or rails Abdominal: Soft.  no distension.  no tenderness.  Musculoskeletal: Normal range of motion Neurological:  normal muscle tone. Coordination normal. No atrophy Skin: Skin warm and dry Psychiatric: normal affect, pleasant  Recent Labs: No results found for requested labs within last 8760 hours.    Lipid Panel Lab Results  Component Value Date   CHOL 292 (H) 01/04/2017   HDL 130 01/04/2017   LDLCALC 147 (H) 01/04/2017   TRIG 76 01/04/2017      Wt Readings from Last 3 Encounters:  04/15/20 208 lb (94.3 kg)  02/08/20 208 lb (94.3 kg)  01/21/20 200 lb (90.7 kg)       ASSESSMENT AND PLAN:  Benign essential HTN - Plan: EKG 12-Lead Recommend he add amlodipine 5 mg daily with his losartan 100 If blood pressure well controlled potentially could go back on his combo pill amlodipine valsartan 5/360 daily as he was on several years ago He will call us with blood pressure measurements  Mixed hyperlipidemia - Plan: EKG 12-Lead On prior clinic visit several years ago we discussed starting Crestor 5  mg daily No recent lipid panel available, 1 has been ordered by primary care  Cardiovascular screening Discussed CT coronary calcium scoring, details provided He will call us if he would like to have this done   Total encounter time more than 45 minutes  Greater than 50% was spent in counseling and coordination of care with the patient    Orders Placed This Encounter  Procedures  . EKG 12-Lead     Signed, Esmond Plants, M.D., Ph.D. 04/15/2020  [place], [place]

## 2020-04-15 ENCOUNTER — Ambulatory Visit (INDEPENDENT_AMBULATORY_CARE_PROVIDER_SITE_OTHER): Payer: 59 | Admitting: Cardiovascular Disease

## 2020-04-15 ENCOUNTER — Other Ambulatory Visit: Payer: Self-pay

## 2020-04-15 ENCOUNTER — Encounter: Payer: Self-pay | Admitting: Cardiovascular Disease

## 2020-04-15 VITALS — BP 160/70 | HR 80 | Ht 72.0 in | Wt 208.0 lb

## 2020-04-15 DIAGNOSIS — I10 Essential (primary) hypertension: Secondary | ICD-10-CM

## 2020-04-15 DIAGNOSIS — E782 Mixed hyperlipidemia: Secondary | ICD-10-CM

## 2020-04-15 DIAGNOSIS — F1010 Alcohol abuse, uncomplicated: Secondary | ICD-10-CM

## 2020-04-15 MED ORDER — AMLODIPINE BESYLATE 5 MG PO TABS
5.0000 mg | ORAL_TABLET | Freq: Every day | ORAL | 3 refills | Status: DC
Start: 2020-04-15 — End: 2021-04-02

## 2020-04-15 NOTE — Patient Instructions (Addendum)
Medication Instructions:  Please add amlodipine 5 mg daily If Blood pressure runs well controlled, call the office We can change to combo pill  If you need a refill on your cardiac medications before your next appointment, please call your pharmacy.    Lab work: No new labs needed   If you have labs (blood work) drawn today and your tests are completely normal, you will receive your results only by: Marland Kitchen MyChart Message (if you have MyChart) OR . A paper copy in the mail If you have any lab test that is abnormal or we need to change your treatment, we will call you to review the results.   Testing/Procedures: No new testing needed   Follow-Up: At [facility], you and your health needs are our priority.  As part of our continuing mission to provide you with exceptional heart care, we have created designated Provider Care Teams.  These Care Teams include your primary Cardiologist (physician) and Advanced Practice Providers (APPs -  Physician Assistants and Nurse Practitioners) who all work together to provide you with the care you need, when you need it.  . You will need a follow up appointment in 12 months   . Providers on your designated Care Team:   . Murray Hodgkins, NP . Christell Faith, PA-C . Marrianne Mood, PA-C  Any Other Special Instructions Will Be Listed Below (If Applicable).  COVID-19 Vaccine Information can be found at: ShippingScam.co.uk For questions related to vaccine distribution or appointments, please email [email or web address] or call [phone].

## 2020-05-01 ENCOUNTER — Ambulatory Visit: Payer: Self-pay | Admitting: Family Medicine

## 2020-05-07 ENCOUNTER — Ambulatory Visit: Payer: Self-pay | Admitting: Family Medicine

## 2020-05-17 ENCOUNTER — Other Ambulatory Visit: Payer: Self-pay | Admitting: Cardiovascular Disease

## 2020-05-17 MED ORDER — LOSARTAN POTASSIUM 100 MG PO TABS: 100.0000 mg | ORAL_TABLET | Freq: Every day | ORAL | 3 refills | Status: DC

## 2020-05-17 NOTE — Telephone Encounter (Signed)
*  STAT* If patient is at the pharmacy, call can be transferred to refill team.   1. Which medications need to be refilled? (please list name of each medication and dose if known) Losartan 100 mg  2. Which pharmacy/location (including street and city if local pharmacy) is medication to be sent to?[facility] in [place]  3. Do they need a 30 day or 90 day supply? [place]

## 2020-06-27 ENCOUNTER — Ambulatory Visit (INDEPENDENT_AMBULATORY_CARE_PROVIDER_SITE_OTHER): Payer: 59 | Admitting: Family Medicine

## 2020-06-27 DIAGNOSIS — J329 Chronic sinusitis, unspecified: Secondary | ICD-10-CM | POA: Diagnosis not present

## 2020-06-27 DIAGNOSIS — G44049 Chronic paroxysmal hemicrania, not intractable: Secondary | ICD-10-CM | POA: Diagnosis not present

## 2020-06-27 MED ORDER — AMOXICILLIN-POT CLAVULANATE 875-125 MG PO TABS: 1.0000 | ORAL_TABLET | Freq: Two times a day (BID) | ORAL | 0 refills | Status: DC

## 2020-07-02 ENCOUNTER — Other Ambulatory Visit: Payer: Self-pay | Admitting: *Deleted

## 2020-07-07 NOTE — Progress Notes (Signed)
Established Patient Office Visit Virtual Visit via Telephone Note  I connected with Christopher Cervantes on 07/07/20 at  9:40 AM EDT by telephone and verified that I am speaking with the correct person using two identifiers.  Location: Patient: Home Provider: Office   I discussed the limitations, risks, security and privacy concerns of performing an evaluation and management service by telephone and the availability of in person appointments. I also discussed with the patient that there may be a patient responsible charge related to this service. The patient expressed understanding and agreed to proceed.   History of Present Illness:    Observations/Objective:   Assessment and Plan:   Follow Up Instructions:    I discussed the assessment and treatment plan with the patient. The patient was provided an opportunity to ask questions and all were answered. The patient agreed with the plan and demonstrated an understanding of the instructions.   The patient was advised to call back or seek an in-person evaluation if the symptoms worsen or if the condition fails to improve as anticipated.  I provided 12 minutes of non-face-to-face time during this encounter.   Christopher Cervantes Mon, MD   Subjective:  Patient ID: Christopher Cervantes, male    DOB: 1956-04-25  Age: 64 y.o. MRN: [id number]  CC: No chief complaint on file.   HPI Christopher Cervantes presents for headache and sinus symptoms.  Past Medical History:  Diagnosis Date  . Dental bridge present    top right  . GERD (gastroesophageal reflux disease)   . HLD (hyperlipidemia)   . HTN (hypertension)   . IBS (irritable bowel syndrome)     Past Surgical History:  Procedure Laterality Date  . COLONOSCOPY WITH PROPOFOL N/A 09/17/2017   Procedure: COLONOSCOPY WITH PROPOFOL;  Surgeon: Lucilla Lame, MD;  Location: [place];  Service: Endoscopy;  Laterality: N/A;  . POLYPECTOMY  09/17/2017   Procedure: POLYPECTOMY INTESTINAL;   Surgeon: Lucilla Lame, MD;  Location: [place];  Service: Endoscopy;;  . TONSILLECTOMY    . VASECTOMY    . WISDOM TOOTH EXTRACTION      Family History  Problem Relation Age of Onset  . Hypertension Mother   . Hyperlipidemia Mother   . Cancer Father   . Hyperlipidemia Sister   . Hypertension Sister   . Hyperlipidemia Brother   . Hypertension Brother     Social History   Socioeconomic History  . Marital status: Married    Spouse name: Not on file  . Number of children: Not on file  . Years of education: Not on file  . Highest education level: Not on file  Occupational History  . Not on file  Tobacco Use  . Smoking status: Former Smoker    Quit date: 12/21/1981    Years since quitting: 38.5  . Smokeless tobacco: Never Used  Vaping Use  . Vaping Use: Never used  Substance and Sexual Activity  . Alcohol use: Yes    Alcohol/week: 2.0 standard drinks    Types: 2 Glasses of wine per week    Comment:    . Drug use: No  . Sexual activity: Not on file  Other Topics Concern  . Not on file  Social History Narrative  . Not on file   Social Determinants of Health   Financial Resource Strain:   . Difficulty of Paying Living Expenses: Not on file  Food Insecurity:   . Worried About Charity fundraiser in the Last  Year: Not on file  . Ran Out of Food in the Last Year: Not on file  Transportation Needs:   . Lack of Transportation (Medical): Not on file  . Lack of Transportation (Non-Medical): Not on file  Physical Activity:   . Days of Exercise per Week: Not on file  . Minutes of Exercise per Session: Not on file  Stress:   . Feeling of Stress : Not on file  Social Connections:   . Frequency of Communication with Friends and Family: Not on file  . Frequency of Social Gatherings with Friends and Family: Not on file  . Attends Religious Services: Not on file  . Active Member of Clubs or Organizations: Not on file  . Attends Archivist Meetings: Not on  file  . Marital Status: Not on file  Intimate Partner Violence:   . Fear of Current or Ex-Partner: Not on file  . Emotionally Abused: Not on file  . Physically Abused: Not on file  . Sexually Abused: Not on file    Outpatient Medications Prior to Visit  Medication Sig Dispense Refill  . amLODipine (NORVASC) 5 MG tablet Take 1 tablet (5 mg total) by mouth daily. 90 tablet 3  . CALCIUM-VITAMIN D PO Take 600 mg by mouth daily.    . Cholecalciferol (VITAMIN D3 PO) Take by mouth.    . Coenzyme Q10 (COQ10 PO) Take by mouth.    . Cyanocobalamin (VITAMIN B-12 PO) Take by mouth.    . esomeprazole (NEXIUM) 10 MG packet Take 10 mg by mouth daily before breakfast.    . finasteride (PROSCAR) 5 MG tablet Take 5 mg by mouth daily.    Marland Kitchen losartan (COZAAR) 100 MG tablet Take 1 tablet (100 mg total) by mouth daily. 90 tablet 3  . Omega-3 Fatty Acids (FISH OIL PO) Take by mouth.    . Probiotic Product (SOLUBLE FIBER/PROBIOTICS PO) Take by mouth daily.    . Red Yeast Rice 600 MG CAPS Take 2 capsules by mouth every other day. Reported on 11/19/2015    . Zinc 50 MG CAPS Take 50 mg by mouth daily.     No facility-administered medications prior to visit.    Allergies  Allergen Reactions  . Lactose     "leaky gut"  . Lactose Intolerance (Gi)     "leaky gut"  . Shellfish Allergy     Stomach issues Stomach issues  . Wheat Bran     "leaky gut"    ROS Review of Systems    Objective:    Physical Exam  There were no vitals taken for this visit. Wt Readings from Last 3 Encounters:  04/15/20 208 lb (94.3 kg)  02/08/20 208 lb (94.3 kg)  01/21/20 200 lb (90.7 kg)     Health Maintenance Due  Topic Date Due  . COVID-19 Vaccine (1) Never done  . HIV Screening  Never done  . INFLUENZA VACCINE  Never done    There are no preventive care reminders to display for this patient.  Lab Results  Component Value Date   TSH 1.580 12/26/2008   Lab Results  Component Value Date   WBC 3.2 12/26/2008    HGB 14.1 12/26/2008   HCT 41.1 12/26/2008   MCV 100 12/26/2008   PLT 306 12/26/2008   Lab Results  Component Value Date   NA 135 01/04/2017   K 4.5 01/04/2017   CO2 25 01/04/2017   GLUCOSE 105 (H) 01/04/2017   BUN 13 01/04/2017  CREATININE 1.04 01/04/2017   BILITOT 0.7 01/04/2017   ALKPHOS 42 01/04/2017   AST 31 01/04/2017   ALT 35 01/04/2017   PROT 7.9 01/04/2017   ALBUMIN 4.7 01/04/2017   CALCIUM 10.1 01/04/2017   Lab Results  Component Value Date   CHOL 292 (H) 01/04/2017   Lab Results  Component Value Date   HDL 130 01/04/2017   Lab Results  Component Value Date   LDLCALC 147 (H) 01/04/2017   Lab Results  Component Value Date   TRIG 76 01/04/2017   Lab Results  Component Value Date   CHOLHDL 2.2 01/04/2017   No results found for: HGBA1C    Assessment & Plan:   Problem List Items Addressed This Visit    None    Visit Diagnoses    Chronic sinusitis, unspecified location    -  Primary   Relevant Medications   amoxicillin-clavulanate (AUGMENTIN) 875-125 MG tablet   Chronic paroxysmal hemicrania, not intractable       Relevant Orders   Sed Rate (ESR)      Meds ordered this encounter  Medications  . amoxicillin-clavulanate (AUGMENTIN) 875-125 MG tablet    Sig: Take 1 tablet by mouth 2 (two) times daily.    Dispense:  20 tablet    Refill:  0   Obtain Covid testing Follow-up: No follow-ups on file.    Wilhemena Durie, MD

## 2020-07-13 LAB — CBC WITH DIFFERENTIAL/PLATELET
Basophils Absolute: 0.1 10*3/uL (ref 0.0–0.2)
Basos: 1 %
EOS (ABSOLUTE): 0 10*3/uL (ref 0.0–0.4)
Eos: 0 %
Hematocrit: 36 % — ABNORMAL LOW (ref 37.5–51.0)
Hemoglobin: 12.8 g/dL — ABNORMAL LOW (ref 13.0–17.7)
Immature Grans (Abs): 0.1 10*3/uL (ref 0.0–0.1)
Immature Granulocytes: 2 %
Lymphocytes Absolute: 1.1 10*3/uL (ref 0.7–3.1)
Lymphs: 23 %
MCH: 35.2 pg — ABNORMAL HIGH (ref 26.6–33.0)
MCHC: 35.6 g/dL (ref 31.5–35.7)
MCV: 99 fL — ABNORMAL HIGH (ref 79–97)
Monocytes Absolute: 0.9 10*3/uL (ref 0.1–0.9)
Monocytes: 19 %
Neutrophils Absolute: 2.5 10*3/uL (ref 1.4–7.0)
Neutrophils: 55 %
Platelets: 195 10*3/uL (ref 150–450)
RBC: 3.64 x10E6/uL — ABNORMAL LOW (ref 4.14–5.80)
RDW: 12.3 % (ref 11.6–15.4)
WBC: 4.7 10*3/uL (ref 3.4–10.8)

## 2020-07-13 LAB — TSH: TSH: 2.56 u[IU]/mL (ref 0.450–4.500)

## 2020-07-13 LAB — COMPREHENSIVE METABOLIC PANEL
ALT: 53 IU/L — ABNORMAL HIGH (ref 0–44)
AST: 78 IU/L — ABNORMAL HIGH (ref 0–40)
Albumin/Globulin Ratio: 1.3 (ref 1.2–2.2)
Albumin: 4.5 g/dL (ref 3.8–4.8)
Alkaline Phosphatase: 56 IU/L (ref 44–121)
BUN/Creatinine Ratio: 13 (ref 10–24)
BUN: 12 mg/dL (ref 8–27)
Bilirubin Total: 0.9 mg/dL (ref 0.0–1.2)
CO2: 23 mmol/L (ref 20–29)
Calcium: 9.4 mg/dL (ref 8.6–10.2)
Chloride: 97 mmol/L (ref 96–106)
Creatinine, Ser: 0.93 mg/dL (ref 0.76–1.27)
GFR calc Af Amer: 101 mL/min/{1.73_m2} (ref >59)
GFR calc non Af Amer: 88 mL/min/{1.73_m2} (ref >59)
Globulin, Total: 3.6 g/dL (ref 1.5–4.5)
Glucose: 133 mg/dL — ABNORMAL HIGH (ref 65–99)
Potassium: 4 mmol/L (ref 3.5–5.2)
Sodium: 136 mmol/L (ref 134–144)
Total Protein: 8.1 g/dL (ref 6.0–8.5)

## 2020-07-13 LAB — LIPID PANEL
Chol/HDL Ratio: 2.3 ratio (ref 0.0–5.0)
Cholesterol, Total: 256 mg/dL — ABNORMAL HIGH (ref 100–199)
HDL: 112 mg/dL (ref >39)
LDL Chol Calc (NIH): 134 mg/dL — ABNORMAL HIGH (ref 0–99)
Triglycerides: 63 mg/dL (ref 0–149)
VLDL Cholesterol Cal: 10 mg/dL (ref 5–40)

## 2020-07-13 LAB — PSA: Prostate Specific Ag, Serum: 0.6 ng/mL (ref 0.0–4.0)

## 2020-07-15 ENCOUNTER — Telehealth: Payer: Self-pay

## 2020-07-15 NOTE — Telephone Encounter (Signed)
Please review pt's recent lab work.   Thanks,   -Mickel Baas

## 2020-07-15 NOTE — Telephone Encounter (Signed)
Copied from [place] [id number]. Topic: General - Call Back - No Documentation >> Jul 15, 2020  3:41 PM Erick Blinks wrote: Reason for CRM: [phone] pt wants a call back to discuss his recent lab results, please advise if [facility] may disclose

## 2020-07-16 NOTE — Telephone Encounter (Signed)
Please review. Ok to schedule CT scan?

## 2020-07-16 NOTE — Telephone Encounter (Signed)
Patient given results as noted by Dr. Rosanna Randy on 07/16/20. Patient verbalized understanding. F/U appointment scheduled for 08/15/20 at 1000. Patient asks if Dr. Rosanna Randy would schedule a CT scan for his constant headaches. He said it was discussed at his last OV.    Richard Maceo Pro., MD  07/16/2020 8:51 AM EST Lipids high and blood count down just a little bit. Main issue is patient is now technically diabetic. Diet and exercise and follow-up in 1 to 2 months to repeat labs and obtain A1c

## 2020-07-29 ENCOUNTER — Telehealth: Payer: Self-pay | Admitting: Family Medicine

## 2020-07-29 DIAGNOSIS — G44049 Chronic paroxysmal hemicrania, not intractable: Secondary | ICD-10-CM

## 2020-07-29 NOTE — Telephone Encounter (Signed)
Patient is calling to report that he is still having headaches on his right side. He is able to control it with asprin. Patient is request a referral for a CAT sign. Patient has discussed this will Dr. Rosanna Randy. Patient has completed all lab work. Please advise CB- [phone]

## 2020-07-30 NOTE — Telephone Encounter (Signed)
Order placed

## 2020-07-30 NOTE — Telephone Encounter (Signed)
Please advise 

## 2020-08-13 ENCOUNTER — Other Ambulatory Visit: Payer: Self-pay

## 2020-08-13 ENCOUNTER — Ambulatory Visit
Admission: RE | Admit: 2020-08-13 | Discharge: 2020-08-13 | Disposition: A | Discharge status: Home / Self Care | Payer: 59 | Source: Ambulatory Visit | Attending: Family Medicine | Admitting: Family Medicine

## 2020-08-13 DIAGNOSIS — G44049 Chronic paroxysmal hemicrania, not intractable: Secondary | ICD-10-CM | POA: Diagnosis not present

## 2020-08-15 ENCOUNTER — Ambulatory Visit: Payer: Self-pay | Admitting: Family Medicine

## 2020-08-22 ENCOUNTER — Ambulatory Visit: Payer: Self-pay | Admitting: Family Medicine

## 2020-09-24 ENCOUNTER — Telehealth: Payer: Self-pay | Admitting: Cardiovascular Disease

## 2020-09-24 NOTE — Telephone Encounter (Signed)
Pt c/o BP issue: STAT if pt c/o blurred vision, one-sided weakness or slurred speech  1. What are your last 5 BP readings?  This morning 170/90 Day before 162/85 These readings are with medication  2. Are you having any other symptoms (ex. Dizziness, headache, blurred vision, passed out)? headaches  3. What is your BP issue? elevated

## 2020-09-25 NOTE — Telephone Encounter (Signed)
Was able to get in touch with pt about his HTN yesterday morning. Pt reports morning BP was 170/90 then again 162/85. Pt reports these BP were taken after he had been at the gym , 5 miles on the elliptical and had his morning coffee. Pt reports no CP, shob, dizziness, or increase HR. Pt reports slight headache to one side of temple has seen ENT, dx with possible sinus infection. Pt reports BP this am was 152/82, this again was BP after exercise and coffee, had recently taken his morning BP medications of Norvasc and Losartan. Educated pt on proper BP techniques. Be at rest for at least 30 mins, avoid caffeine and exercise 30 mins piror to taking his BP. Take BP at least 1 1/2-2hrs after his BP medications, sitting in chair with arm resting on table. Pt verbalized understanding, will follow instructions on BP management and will monitor his BP, will call back for anything further for continued HTN.

## 2020-10-21 ENCOUNTER — Ambulatory Visit: Payer: Self-pay | Admitting: *Deleted

## 2020-10-21 NOTE — Telephone Encounter (Signed)
Per initial encounter, "Patient tested positive (via at-home test) for COVID 19; Patient feels well and is mobile but continuing to experience a fever of 99.4 degrees and "mild fatigue"; Patient has been experiencing symptoms since Saturday (10/19/20) morning " Patient would like to be contacted to discuss further; contacted pt to discuss his symptoms; the pt says he has cough and fever; he is taking OTC corcidon cough syrup;  tier of symptoms reviewed; recommendations made per nurse triage protocol; instructions given to pt: Self-Isolation, Ending Isolation, ED/UC . Instruct the patient to remain in self-quarantine until they meet the "Non-Test Criteria for Ending Self-Isolation". Non-Test Criteria for Ending Self-Isolation All persons with fever and respiratory symptoms should isolate themselves until ALL conditions listed below are met: - at least 5 days since symptoms onset - AND 3 consecutive days fever free without antipyretics (acetaminophen [Tylenol] or ibuprofen [Advil]) - AND improvement in respiratory symptoms . If the patient develops respiratory issues/distress, seek medical care in the Emergency Department, call 911, reports symptoms and report COVID-19 positive test. Patient Instructions . continue to utilize over-the-counter medications for fever (ibuprofen and/or Tylenol) and cough (cough medicine and/or sore throat lozenges). . wear a mask around people and follow good infection prevention techniques. . Patient to should only leave home to seek medical care. . to send family for food, prescriptions or medicines; or use delivery service.  . If the patient must leave the home, they must wear a mask in public. Marland Kitchen limit contact with immediate family members or caregivers in the home, and use mask, social distancing, and handwashing to decrease risk to patients. o Please continue good preventive care measures, including frequent hand washing, avoid touching your face, cover coughs/sneezes  with tissue or into elbow, stay out of crowds and keep a 6-foot distance from others.   . patient and family to clean hard surfaces touched by patient frequently with household cleaning product  he verbalized understanding; the pt sees Dr Sullivan Lone, [facility]; will route to office for notification.  Reason for Disposition . HIGH RISK for severe COVID complications (e.g., age > 64 years, obesity with BMI > 25, pregnant, chronic lung disease or other chronic medical condition)  (Exception: Already seen by PCP and no new or worsening symptoms.)  Answer Assessment - Initial Assessment Questions 1. COVID-19 DIAGNOSIS: "Who made your COVID-19 diagnosis?" "Was it confirmed by a positive lab test?" If not diagnosed by a HCP, ask "Are there lots of cases (community spread) where you live?" Note: See public health department website, if unsure.     + home test and [facility]  2. COVID-19 EXPOSURE: "Was there any known exposure to COVID before the symptoms began?" CDC Definition of close contact: within 6 feet (2 meters) for a total of 15 minutes or more over a 24-hour period.   3. ONSET: "When did the COVID-19 symptoms start?"      4. WORST SYMPTOM: "What is your worst symptom?" (e.g., cough, fever, shortness of breath, muscle aches)  cough 5. COUGH: "Do you have a cough?" If Yes, ask: "How bad is the cough?"      yes 6. FEVER: "Do you have a fever?" If Yes, ask: "What is your temperature, how was it measured, and when did it start?"    Yes, trending down 7. RESPIRATORY STATUS: "Describe your breathing?" (e.g., shortness of breath, wheezing, unable to speak)     8. BETTER-SAME-WORSE: "Are you getting better, staying the same or getting worse compared to yesterday?"  If getting  worse, ask, "In what way?" better 9. HIGH RISK DISEASE: "Do you have any chronic medical problems?" (e.g., asthma, heart or lung disease, weak immune system, obesity, etc.)     HTN 10. VACCINE: "Have you gotten the  COVID-19 vaccine?" If Yes ask: "Which one, how many shots, when did you get it?"        11. PREGNANCY: "Is there any chance you are pregnant?" "When was your last menstrual period?"      12. OTHER SYMPTOMS: "Do you have any other symptoms?"  (e.g., chills, fatigue, headache, loss of smell or taste, muscle pain, sore throat; new loss of smell or taste especially support the diagnosis of COVID-19)  Protocols used: CORONAVIRUS (COVID-19) DIAGNOSED OR SUSPECTED-A-AH

## 2020-10-23 ENCOUNTER — Telehealth: Payer: Self-pay

## 2020-10-23 NOTE — Telephone Encounter (Signed)
Copied from [place] [id number]. Topic: General - Other >> Oct 23, 2020  2:49 PM Mcneil, Ja-Kwan wrote: Reason for CRM: Pt stated he tested positive for Covid and would like to discuss possibly getting antibody treatment. Pt requests call back. Cb# [phone]

## 2020-10-23 NOTE — Telephone Encounter (Signed)
Please set up with a virtual appointment with someone.

## 2020-10-24 ENCOUNTER — Encounter: Payer: Self-pay | Admitting: Family Medicine

## 2020-10-24 ENCOUNTER — Telehealth (INDEPENDENT_AMBULATORY_CARE_PROVIDER_SITE_OTHER): Payer: 59 | Admitting: Family Medicine

## 2020-10-24 VITALS — Temp 99.0°F

## 2020-10-24 DIAGNOSIS — I10 Essential (primary) hypertension: Secondary | ICD-10-CM

## 2020-10-24 DIAGNOSIS — R059 Cough, unspecified: Secondary | ICD-10-CM | POA: Diagnosis not present

## 2020-10-24 DIAGNOSIS — U071 COVID-19: Secondary | ICD-10-CM | POA: Diagnosis not present

## 2020-10-24 NOTE — Progress Notes (Signed)
Virtual telephone visit    Virtual Visit via Telephone Note   This visit type was conducted due to national recommendations for restrictions regarding the COVID-19 Pandemic (e.g. social distancing) in an effort to limit this patient's exposure and mitigate transmission in our community. Due to his co-morbid illnesses, this patient is at least at moderate risk for complications without adequate follow up. This format is felt to be most appropriate for this patient at this time. The patient did not have access to video technology or had technical difficulties with video requiring transitioning to audio format only (telephone). Physical exam was limited to content and character of the telephone converstion.    Patient location: home Provider location: office  I discussed the limitations of evaluation and management by telemedicine and the availability of in person appointments. The patient expressed understanding and agreed to proceed.   Visit Date: 10/24/2020  Today's healthcare provider: Wilhemena Durie, MD   No chief complaint on file.  Subjective    HPI  Symptoms for 5 days including head congestion cough without shortness of breath and no fever, temperature maximum of 101 and now 99-100.  No myalgias no headache. He is not vaccinated against Covid.  Tested positive the day after he developed her symptoms.  Test done at [facility].       Medications: Outpatient Medications Prior to Visit  Medication Sig  . amLODipine (NORVASC) 5 MG tablet Take 1 tablet (5 mg total) by mouth daily.  Marland Kitchen CALCIUM-VITAMIN D PO Take 600 mg by mouth daily.  . Cholecalciferol (VITAMIN D3 PO) Take by mouth.  . Cyanocobalamin (VITAMIN B-12 PO) Take by mouth.  . esomeprazole (NEXIUM) 10 MG packet Take 10 mg by mouth daily before breakfast.  . losartan (COZAAR) 100 MG tablet Take 1 tablet (100 mg total) by mouth daily.  . Omega-3 Fatty Acids (FISH OIL PO) Take by mouth.  . Probiotic Product (SOLUBLE  FIBER/PROBIOTICS PO) Take by mouth daily.  . Red Yeast Rice 600 MG CAPS Take 2 capsules by mouth every other day. Reported on 11/19/2015  . Zinc 50 MG CAPS Take 50 mg by mouth daily.  Marland Kitchen amoxicillin-clavulanate (AUGMENTIN) 875-125 MG tablet Take 1 tablet by mouth 2 (two) times daily. (Patient not taking: Reported on 10/24/2020)  . Coenzyme Q10 (COQ10 PO) Take by mouth. (Patient not taking: Reported on 10/24/2020)  . finasteride (PROSCAR) 5 MG tablet Take 5 mg by mouth daily. (Patient not taking: Reported on 10/24/2020)   No facility-administered medications prior to visit.    Review of Systems     Objective    There were no vitals taken for this visit. BP Readings from Last 3 Encounters:  04/15/20 (!) 160/70  02/08/20 137/86  01/21/20 (!) 150/94   Wt Readings from Last 3 Encounters:  04/15/20 208 lb (94.3 kg)  02/08/20 208 lb (94.3 kg)  01/21/20 200 lb (90.7 kg)        Assessment & Plan     1. COVID-19 Unvaccinated patient with COVID-19 infection.  At this time for to ambulatory therapy for consideration of care. Push fluids and take vitamin C vitamin D and zinc daily.  If he gets short of breath proceed for further evaluation to the emergency room  - Ambulatory referral for Covid Treatment  2. Cough Robitussin-DM twice a day and push fluids - Ambulatory referral for Covid Treatment  3. Benign essential HTN Avoid decongestant   No follow-ups on file.    I discussed the assessment  and treatment plan with the patient. The patient was provided an opportunity to ask questions and all were answered. The patient agreed with the plan and demonstrated an understanding of the instructions.   The patient was advised to call back or seek an in-person evaluation if the symptoms worsen or if the condition fails to improve as anticipated.  I provided 11 minutes of non-face-to-face time during this encounter.  I, Wilhemena Durie, MD, have reviewed all documentation for this  visit. The documentation on 10/24/20 for the exam, diagnosis, procedures, and orders are all accurate and complete.   Teddie Mehta Cranford Mon, MD [facility] [phone] (phone) [phone] (fax)  [place]

## 2020-10-24 NOTE — Progress Notes (Deleted)
    MyChart Video Visit    Virtual Visit via Video Note   This visit type was conducted due to national recommendations for restrictions regarding the COVID-19 Pandemic (e.g. social distancing) in an effort to limit this patient's exposure and mitigate transmission in our community. This patient is at least at moderate risk for complications without adequate follow up. This format is felt to be most appropriate for this patient at this time. Physical exam was limited by quality of the video and audio technology used for the visit.   Patient location: *** Provider location: ***  I discussed the limitations of evaluation and management by telemedicine and the availability of in person appointments. The patient expressed understanding and agreed to proceed.  Patient: Christopher Cervantes   DOB: Mar 26, 1956   65 y.o. Male  MRN: [id number] Visit Date: 10/24/2020  Today's healthcare provider: Wilhemena Durie, MD   No chief complaint on file.  Subjective    HPI  ***  {Show patient history (optional):23778::" "}  Medications: Outpatient Medications Prior to Visit  Medication Sig  . amLODipine (NORVASC) 5 MG tablet Take 1 tablet (5 mg total) by mouth daily.  Marland Kitchen amoxicillin-clavulanate (AUGMENTIN) 875-125 MG tablet Take 1 tablet by mouth 2 (two) times daily.  Marland Kitchen CALCIUM-VITAMIN D PO Take 600 mg by mouth daily.  . Cholecalciferol (VITAMIN D3 PO) Take by mouth.  . Coenzyme Q10 (COQ10 PO) Take by mouth.  . Cyanocobalamin (VITAMIN B-12 PO) Take by mouth.  . esomeprazole (NEXIUM) 10 MG packet Take 10 mg by mouth daily before breakfast.  . finasteride (PROSCAR) 5 MG tablet Take 5 mg by mouth daily.  Marland Kitchen losartan (COZAAR) 100 MG tablet Take 1 tablet (100 mg total) by mouth daily.  . Omega-3 Fatty Acids (FISH OIL PO) Take by mouth.  . Probiotic Product (SOLUBLE FIBER/PROBIOTICS PO) Take by mouth daily.  . Red Yeast Rice 600 MG CAPS Take 2 capsules by mouth every other day. Reported on 11/19/2015  .  Zinc 50 MG CAPS Take 50 mg by mouth daily.   No facility-administered medications prior to visit.    Review of Systems  {Labs  Heme  Chem  Endocrine  Serology  Results Review (optional):23779::" "}  Objective    There were no vitals taken for this visit. {Show previous vital signs (optional):23777::" "}  Physical Exam     Assessment & Plan     ***  No follow-ups on file.     I discussed the assessment and treatment plan with the patient. The patient was provided an opportunity to ask questions and all were answered. The patient agreed with the plan and demonstrated an understanding of the instructions.   The patient was advised to call back or seek an in-person evaluation if the symptoms worsen or if the condition fails to improve as anticipated.  I provided *** minutes of non-face-to-face time during this encounter.  {provider attestation***:1}  Wilhemena Durie, MD [facility] [phone] (phone) [phone] (fax)  [place]

## 2020-10-25 ENCOUNTER — Ambulatory Visit (HOSPITAL_COMMUNITY)
Admission: RE | Admit: 2020-10-25 | Discharge: 2020-10-25 | Disposition: A | Discharge status: Home / Self Care | Payer: 59 | Source: Ambulatory Visit | Attending: Pulmonary Disease | Admitting: Pulmonary Disease

## 2020-10-25 ENCOUNTER — Telehealth: Payer: Self-pay | Admitting: Family

## 2020-10-25 ENCOUNTER — Other Ambulatory Visit: Payer: Self-pay | Admitting: Family

## 2020-10-25 DIAGNOSIS — E782 Mixed hyperlipidemia: Secondary | ICD-10-CM | POA: Insufficient documentation

## 2020-10-25 DIAGNOSIS — I10 Essential (primary) hypertension: Secondary | ICD-10-CM

## 2020-10-25 DIAGNOSIS — U071 COVID-19: Secondary | ICD-10-CM | POA: Insufficient documentation

## 2020-10-25 MED ORDER — ALBUTEROL SULFATE HFA 108 (90 BASE) MCG/ACT IN AERS: 2.0000 | INHALATION_SPRAY | Freq: Once | RESPIRATORY_TRACT | Status: DC | PRN

## 2020-10-25 MED ORDER — SODIUM CHLORIDE 0.9 % IV SOLN: INTRAVENOUS | Status: DC | PRN

## 2020-10-25 MED ORDER — FAMOTIDINE IN NACL 20-0.9 MG/50ML-% IV SOLN: 20.0000 mg | Freq: Once | INTRAVENOUS | Status: DC | PRN

## 2020-10-25 MED ORDER — EPINEPHRINE 0.3 MG/0.3ML IJ SOAJ: 0.3000 mg | Freq: Once | INTRAMUSCULAR | Status: DC | PRN

## 2020-10-25 MED ORDER — METHYLPREDNISOLONE SODIUM SUCC 125 MG IJ SOLR: 125.0000 mg | Freq: Once | INTRAMUSCULAR | Status: DC | PRN

## 2020-10-25 MED ORDER — SOTROVIMAB 500 MG/8ML IV SOLN
500.0000 mg | Freq: Once | INTRAVENOUS | Status: AC
  Administered 2020-10-25: 500 mg via INTRAVENOUS

## 2020-10-25 MED ORDER — DIPHENHYDRAMINE HCL 50 MG/ML IJ SOLN: 50.0000 mg | Freq: Once | INTRAMUSCULAR | Status: DC | PRN

## 2020-10-25 NOTE — Discharge Instructions (Signed)

## 2020-10-25 NOTE — Progress Notes (Signed)
I connected by phone with Christopher Cervantes on 10/25/2020 at 10:21 AM to discuss the potential use of a new treatment for mild to moderate COVID-19 viral infection in non-hospitalized patients.  This patient is a 65 y.o. male that meets the FDA criteria for Emergency Use Authorization of COVID monoclonal antibody sotrovimab.  Has a (+) direct SARS-CoV-2 viral test result  Has mild or moderate COVID-19   Is NOT hospitalized due to COVID-19  Is within 10 days of symptom onset  Has at least one of the high risk factor(s) for progression to severe COVID-19 and/or hospitalization as defined in EUA.  Specific high risk criteria : BMI > 25 and Cardiovascular disease or hypertension   I have spoken and communicated the following to the patient or parent/caregiver regarding COVID monoclonal antibody treatment:  1. FDA has authorized the emergency use for the treatment of mild to moderate COVID-19 in adults and pediatric patients with positive results of direct SARS-CoV-2 viral testing who are 78 years of age and older weighing at least 40 kg, and who are at high risk for progressing to severe COVID-19 and/or hospitalization.  2. The significant known and potential risks and benefits of COVID monoclonal antibody, and the extent to which such potential risks and benefits are unknown.  3. Information on available alternative treatments and the risks and benefits of those alternatives, including clinical trials.  4. Patients treated with COVID monoclonal antibody should continue to self-isolate and use infection control measures (e.g., wear mask, isolate, social distance, avoid sharing personal items, clean and disinfect "high touch" surfaces, and frequent handwashing) according to CDC guidelines.   5. The patient or parent/caregiver has the option to accept or refuse COVID monoclonal antibody treatment.  After reviewing this information with the patient, the patient has agreed to receive one of the  available covid 19 monoclonal antibodies and will be provided an appropriate fact sheet prior to infusion.   Mauricio Po, FNP 10/25/2020 10:21 AM

## 2020-10-25 NOTE — Progress Notes (Signed)
Patient reviewed Fact Sheet for Patients, Parents, and Caregivers for Emergency Use Authorization (EUA) of sotrovimab for the Treatment of Coronavirus. Patient also reviewed and is agreeable to the estimated cost of treatment. Patient is agreeable to proceed.   

## 2020-10-25 NOTE — Telephone Encounter (Signed)
Called to discuss with patient about COVID-19 symptoms and the use of one of the available treatments for those with mild to moderate Covid symptoms and at a high risk of hospitalization.  Pt appears to qualify for outpatient treatment due to co-morbid conditions and/or a member of an at-risk group in accordance with the FDA Emergency Use Authorization.    Symptom onset: 10/19/20 Vaccinated: No Booster? No Immunocompromised? No Qualifiers: Hypertension, hyperlipidemia, BMI 28  Christopher Cervantes was seen through Telehealth visit with his primary care office on 2/17 with head congestion and cough. Tested positive for Covid at [facility].  Discussed the risks, benefits, and potential financial costs associated with treatment with Sotrovimab and he wishes to proceed with treatment.   [name],   We contacted you because you were recently diagnosed with COVID-19 and may benefit from a new treatment for mild to moderate disease. This treatment helps reduce the chance of being hospitalized. For some patients with medical conditions that may increase the chances of an infection, the treatment also decreases the risk for serious symptoms related to COVID-19.   The Food and Drug Administration (FDA) approved emergency use of a new drug to treat patients with mild to moderate symptoms who have risk factors that could cause severe symptoms related to COVID-19. This new treatment is a monoclonal antibody. It works by attaching like a magnet to the SARS-CoV2 virus (the virus that causes COVID-19) and stops it from infecting more cells in your body. It does not kill the virus, but it prevents it from spreading throughout your body with the hope that it will decrease your symptoms after it is administered.   This new drug is an intravenous (IV) infusion called Sotrovimab that is given over one 30-minute session in our [facility] outpatient infusion clinic. You will need to stay about 60 minutes after the infusion  to ensure you are tolerating it well and to watch for any allergic reaction to the medication. More information will be given to you at the time of your appointment.   Important information:  . The potential side effects: 2-4% of recipients experience nausea, vomiting, diarrhea, dizziness, headaches, itching, worsening fevers or chills for around 24 hours. . There have been no serious infusion-related reactions. . Of the more than 3,000 patients who received the infusion, only one had an allergic response that ended once the infusion was stopped. This is why we monitor all of our patients closely for 60 minutes after the infusion.  . The COVID-19 vaccine (including boosters) no longer needs to be delayed after this infusion and can be safely administered after you have completed your isolation period.  . The medication itself is free, but your insurance will be charged an infusion fee. The amount you may owe later varies from insurance to insurance. If you do not have insurance, we can put you in touch with our billing department. Please contact your insurance agent to discuss prior to your appointment if you would like further details about billing specific to your policy. The CMS code is: M66  . If you have been tested outside of a [facility], you MUST bring a copy of your positive test with you the morning of your appointment. You may take a photo of this and upload to your MyChart portal,  have the testing facility fax the result to [phone] or email a copy to [email or web address].    You have been scheduled to receive the monoclonal antibody therapy at [facility]  Health:  10/25/20 at 2:30pm    The address for the infusion clinic site is:   --The GPS address is [address], and the parking is located near the [employer] building where you will see a "COVID19 Infusion" feather banner marking the entrance to parking. (See photos below.)            --Enter into the  2nd entrance where the "wave, flag banner" is at the road. Turn into this 2nd entrance and immediately turn left or right to park in one of the marked spaces.   --Please stay in your car and call the desk for assistance inside at [phone]. Let us know which space you are in.    --The average time in the department is roughly 90 minutes to two hours for monoclonal treatment. This includes preparation of the medication, IV start and the required 60-minute monitoring after the infusion.     Should you develop worsening shortness of breath, chest pain or severe breathing problems, please do not wait for this appointment and go to the emergency room for evaluation and treatment instead. You will undergo another oxygen screen before your infusion to ensure this is the best treatment option for you. There is a chance that the best decision may be to send you to the emergency room for evaluation at the time of your appointment.    The day of your visit, you should: Marland Kitchen Get plenty of rest the night before and drink plenty of water. . Eat a light meal/snack before coming and take your medications as prescribed.  . Wear warm, comfortable clothes with a shirt that can roll-up over the elbow (will need IV start).  . Wear a mask.  . Consider bringing an activity to help pass the time.  Terri Piedra, NP 10/25/2020 10:21 AM

## 2020-10-28 ENCOUNTER — Telehealth: Payer: Self-pay | Admitting: Family Medicine

## 2020-10-28 DIAGNOSIS — R059 Cough, unspecified: Secondary | ICD-10-CM

## 2020-10-28 NOTE — Telephone Encounter (Signed)
Patient called again in reference to the cough syrup because he has a hard time especially at night and wanted to see if the prescription had been called in yet. He would like a call back. He can be reached at [phone]. Please advise

## 2020-10-28 NOTE — Telephone Encounter (Signed)
Pt had video visit on 10-25-2019 with dr Rosanna Randy  dx with covid and had infusion on 10-25-2020 . Pt wife is calling and patient  still has cough. Pt has tried OTC robitusin dm, mucinex and coricidin with no relief. Pt wife is calling and would like cough medication pills or cough syrup to be call into [facility] in [place] [address] phone number [phone]. Pt wife would like to know if he could alternative with pills and cough syrup.  Pt will see dermatologist today for extreme eczema on legs mainly.

## 2020-10-28 NOTE — Telephone Encounter (Signed)
Please advise 

## 2020-10-29 MED ORDER — HYDROCODONE-HOMATROPINE 5-1.5 MG/5ML PO SYRP
5.0000 mL | ORAL_SOLUTION | Freq: Four times a day (QID) | ORAL | 0 refills | Status: DC | PRN
Start: 2020-10-29 — End: 2022-03-11

## 2020-10-29 NOTE — Telephone Encounter (Signed)
Already sent Rx

## 2021-01-06 NOTE — Progress Notes (Deleted)
Cardiology Office Note  Date:  01/06/2021   ID:  Blanchard, Willhite 06-08-1956, MRN [id number]  PCP:  Jerrol Banana., MD   Cc:   HPI:  Mr. Flury is a 65 year-old gentleman with past medical history of  hypertension,  hyperlipidemia,  irritable bowel syndrome  prostate issues,  palpitations,  Alcohol abuse who presents for evaluation of his hypertension and hyperlipidemia  Last seen by myself in clinic August 2021  Received a call in January 2022, blood pressure was elevated after working out at [employer], possible sinus infection COVID + February 2022      urgent care and emergency room May 2021 for hypertension He had stopped his blood pressure medication, was taking a supplement Drinking alcohol on a regular basis, "has withdrawal symptoms if he does not drink" Blood pressure in the emergency room 167/97  Transitioned back to Dr. 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  PMH:   has a past medical history of  Dental bridge present, GERD (gastroesophageal reflux disease), HLD (hyperlipidemia), HTN (hypertension), and IBS (irritable bowel syndrome).  PSH:    Past Surgical History:  Procedure Laterality Date  . COLONOSCOPY WITH PROPOFOL N/A 09/17/2017   Procedure: COLONOSCOPY WITH PROPOFOL;  Surgeon: Lucilla Lame, MD;  Location: [facility];  Service: Endoscopy;  Laterality: N/A;  . POLYPECTOMY  09/17/2017   Procedure: POLYPECTOMY INTESTINAL;  Surgeon: Lucilla Lame, MD;  Location: [place];  Service: Endoscopy;;  . TONSILLECTOMY    . VASECTOMY    . WISDOM TOOTH EXTRACTION      Current Outpatient Medications on File Prior to Visit  Medication Sig Dispense Refill  . amLODipine (NORVASC) 5 MG tablet Take 1 tablet (5 mg total) by mouth daily. 90 tablet 3  . amoxicillin-clavulanate (AUGMENTIN) 875-125 MG tablet Take 1 tablet by mouth 2 (two) times daily. (Patient not taking: Reported on 10/24/2020) 20 tablet 0  . CALCIUM-VITAMIN D PO Take 600 mg by mouth daily.    . Cholecalciferol (VITAMIN D3 PO) Take by mouth.    . Coenzyme Q10 (COQ10 PO) Take by mouth. (Patient not taking: Reported on 10/24/2020)    . Cyanocobalamin (VITAMIN B-12 PO) Take by mouth.    . esomeprazole (NEXIUM) 10 MG packet Take 10 mg by mouth daily before breakfast.    . finasteride (PROSCAR) 5 MG tablet Take 5 mg by mouth daily. (Patient not taking: Reported on 10/24/2020)    . HYDROcodone-homatropine (HYCODAN) 5-1.5  MG/5ML syrup Take 5 mLs by mouth every 6 (six) hours as needed for cough. 120 mL 0  . losartan (COZAAR) 100 MG tablet Take 1 tablet (100 mg total) by mouth daily. 90 tablet 3  . Omega-3 Fatty Acids (FISH OIL PO) Take by mouth.    . Probiotic Product (SOLUBLE FIBER/PROBIOTICS PO) Take by mouth daily.    . Red Yeast Rice 600 MG CAPS Take 2 capsules by mouth every other day. Reported on 11/19/2015    . Zinc 50 MG CAPS Take 50 mg by mouth daily.    . [DISCONTINUED] lisinopril (PRINIVIL,ZESTRIL) 40 MG tablet  TAKE 1 TABLET(40 MG) BY MOUTH DAILY 30 tablet 0   No current facility-administered medications on file prior to visit.     Allergies:   Lactose, Lactose intolerance (gi), Shellfish allergy, and Wheat bran   Social History:  The patient  reports that he quit smoking about 39 years ago. He has never used smokeless tobacco. He reports current alcohol use of about 2.0 standard drinks of alcohol per week. He reports that he does not use drugs.   Family History:   family history includes Cancer in his father; Hyperlipidemia in his brother, mother, and sister; Hypertension in his brother, mother, and sister.    Review of Systems: Review of Systems  Constitutional: Negative.   Respiratory: Negative.   Cardiovascular: Negative.   Gastrointestinal: Negative.   Musculoskeletal: Negative.   Neurological: Negative.   Psychiatric/Behavioral: Negative.   All other systems reviewed and are negative.   PHYSICAL EXAM: VS:  There were no vitals taken for this visit. , BMI There is no height or weight on file to calculate BMI. Constitutional:  oriented to person, place, and time. No distress.  HENT:  Head: Grossly normal Eyes:  no discharge. No scleral icterus.  Neck: No JVD, no carotid bruits  Cardiovascular: Regular rate and rhythm, no murmurs appreciated Pulmonary/Chest: Clear to auscultation bilaterally, no wheezes or rails Abdominal: Soft.  no distension.  no tenderness.  Musculoskeletal: Normal range of motion Neurological:  normal muscle tone. Coordination normal. No atrophy Skin: Skin warm and dry Psychiatric: normal affect, pleasant  Recent Labs: 07/12/2020: ALT 53; BUN 12; Creatinine, Ser 0.93; Hemoglobin 12.8; Platelets 195; Potassium 4.0; Sodium 136; TSH 2.560    Lipid Panel Lab Results  Component Value Date   CHOL 256 (H) 07/12/2020   HDL 112 07/12/2020   LDLCALC 134 (H) 07/12/2020   TRIG 63 07/12/2020      Wt Readings from Last 3 Encounters:  04/15/20 208 lb (94.3 kg)   02/08/20 208 lb (94.3 kg)  01/21/20 200 lb (90.7 kg)       ASSESSMENT AND PLAN:  Benign essential HTN - Plan: EKG 12-Lead Recommend he add amlodipine 5 mg daily with his losartan 100 If blood pressure well controlled potentially could go back on his combo pill amlodipine valsartan 5/360 daily as he was on several years ago He will call us with blood pressure measurements  Mixed hyperlipidemia - Plan: EKG 12-Lead On prior clinic visit several years ago we discussed starting Crestor 5 mg daily No recent lipid panel available, 1 has been ordered by primary care  Cardiovascular screening Discussed CT coronary calcium scoring, details provided He will call us if he would like to have this done   Total encounter time more than 45 minutes  Greater than 50% was spent in counseling and coordination of care with the patient    No orders of the defined types  were placed in this encounter.    Signed, Esmond Plants, M.D., Ph.D. 01/06/2021  [facility] [place], [place] [phone]

## 2021-01-08 ENCOUNTER — Ambulatory Visit: Payer: 59 | Admitting: Cardiovascular Disease

## 2021-01-08 DIAGNOSIS — F1010 Alcohol abuse, uncomplicated: Secondary | ICD-10-CM

## 2021-01-08 DIAGNOSIS — E782 Mixed hyperlipidemia: Secondary | ICD-10-CM

## 2021-01-08 DIAGNOSIS — I10 Essential (primary) hypertension: Secondary | ICD-10-CM

## 2021-01-16 NOTE — Progress Notes (Deleted)
Cardiology Office Note    Date:  01/16/2021   ID:  Moxon, Messler 09/22/1955, MRN [id number]  PCP:  Jerrol Banana., MD  Cardiologist:  Ida Rogue, MD  Electrophysiologist:  None   Chief Complaint: ***  History of Present Illness:   KNOWLEDGE ESCANDON is a 65 y.o. male with history of COVID infection in 10/2020 status post monoclonal antibody infusion, HTN, HLD, palpitations, headache, IBS, and alcohol use who presents for evaluation of  He has been followed by Dr. Rockey Situ for hypertension and hyperlipidemia.  Notes indicate he continues to drink alcohol on a regular basis and has previously stopped taking antihypertensive medication with initiation of a "supplement."  He was last seen in the office in 04/2020 and reported he had quit drinking alcohol.  He was exercising on an elliptical 30 to 40 minutes/day.  No prior cardiac testing for review.  BP was elevated at that visit at 160/70.  It was recommended amlodipine 5 mg be added to his current regimen of losartan 100 mg with consideration to go back on amlodipine/valsartan if needed.  CT head in 08/2020 for intractable headache showed no acute intracranial abnormality.  ***   Labs independently reviewed: 07/2020 - BUN 12, serum creatinine 0.93, potassium 4.0, albumin 4.5, AST 78, ALT 53, Hgb 12.8, PLT 195, TSH normal, TC 256, TG 63, HDL 112, LDL 134  Past Medical History:  Diagnosis Date  . Dental bridge present    top right  . GERD (gastroesophageal reflux disease)   . HLD (hyperlipidemia)   . HTN (hypertension)   . IBS (irritable bowel syndrome)     Past Surgical History:  Procedure Laterality Date  . COLONOSCOPY WITH PROPOFOL N/A 09/17/2017   Procedure: COLONOSCOPY WITH PROPOFOL;  Surgeon: Lucilla Lame, MD;  Location: [facility];  Service: Endoscopy;  Laterality: N/A;  . POLYPECTOMY  09/17/2017   Procedure: POLYPECTOMY INTESTINAL;  Surgeon: Lucilla Lame, MD;  Location: [facility];  Service:  Endoscopy;;  . TONSILLECTOMY    . VASECTOMY    . WISDOM TOOTH EXTRACTION      Current Medications: No outpatient medications have been marked as taking for the 01/17/21 encounter (Appointment) with Rise Mu, PA-C.    Allergies:   Lactose, Lactose intolerance (gi), Shellfish allergy, and Wheat bran   Social History   Socioeconomic History  . Marital status: Married    Spouse name: Not on file  . Number of children: Not on file  . Years of education: Not on file  . Highest education level: Not on file  Occupational History  . Not on file  Tobacco Use  . Smoking status: Former Smoker    Quit date: 12/21/1981    Years since quitting: 39.0  . Smokeless tobacco: Never Used  Vaping Use  . Vaping Use: Never used  Substance and Sexual Activity  . Alcohol use: Yes    Alcohol/week: 2.0 standard drinks    Types: 2 Glasses of wine per week    Comment:    . Drug use: No  . Sexual activity: Not on file  Other Topics Concern  . Not on file  Social History Narrative  . Not on file   Social Determinants of Health   Financial Resource Strain: Not on file  Food Insecurity: Not on file  Transportation Needs: Not on file  Physical Activity: Not on file  Stress: Not on file  Social Connections: Not on file     Family History:  The patient's family history includes Cancer in his father; Hyperlipidemia in his brother, mother, and sister; Hypertension in his brother, mother, and sister.  ROS:   ROS   EKGs/Labs/Other Studies Reviewed:    Studies reviewed were summarized above. The additional studies were reviewed today: None available for review.  EKG:  EKG is ordered today.  The EKG ordered today demonstrates ***  Recent Labs: 07/12/2020: ALT 53; BUN 12; Creatinine, Ser 0.93; Hemoglobin 12.8; Platelets 195; Potassium 4.0; Sodium 136; TSH 2.560  Recent Lipid Panel    Component Value Date/Time   CHOL 256 (H) 07/12/2020 0842   TRIG 63 07/12/2020 0842   TRIG 72 12/26/2008  0000   HDL 112 07/12/2020 0842   CHOLHDL 2.3 07/12/2020 0842   LDLCALC 134 (H) 07/12/2020 0842   LDLCALC 131 12/26/2008 0000    PHYSICAL EXAM:    VS:  There were no vitals taken for this visit.  BMI: There is no height or weight on file to calculate BMI.  Physical Exam  Wt Readings from Last 3 Encounters:  04/15/20 208 lb (94.3 kg)  02/08/20 208 lb (94.3 kg)  01/21/20 200 lb (90.7 kg)     ASSESSMENT & PLAN:   1. ***  2. HTN: Blood pressure ***  3. HLD: LDL 134 in 07/2020.  Disposition: F/u with Dr. Rockey Situ or an APP in ***.   Medication Adjustments/Labs and Tests Ordered: Current medicines are reviewed at length with the patient today.  Concerns regarding medicines are outlined above. Medication changes, Labs and Tests ordered today are summarized above and listed in the Patient Instructions accessible in Encounters.   Signed, Christell Faith, PA-C 01/16/2021 7:51 AM     [facility] [address] [place] [place], [place] [address] [phone]

## 2021-01-17 ENCOUNTER — Ambulatory Visit: Payer: 59 | Admitting: Physician Assistant

## 2021-03-24 ENCOUNTER — Ambulatory Visit: Payer: 59 | Admitting: Cardiovascular Disease

## 2021-04-02 ENCOUNTER — Other Ambulatory Visit: Payer: Self-pay | Admitting: Cardiovascular Disease

## 2021-04-02 NOTE — Telephone Encounter (Signed)
Please schedule 12 month F/U appointment. Thank you! 

## 2021-04-22 ENCOUNTER — Telehealth: Payer: Self-pay | Admitting: *Deleted

## 2021-04-22 NOTE — Telephone Encounter (Signed)
Please advise referral?  

## 2021-04-22 NOTE — Telephone Encounter (Signed)
Copied from [place] [id number]. Topic: Referral - Request for Referral >> Apr 07, 2021  9:21 AM Yvette Rack wrote: Has patient seen PCP for this complaint? Yes.   *If NO, is insurance requiring patient see PCP for this issue before PCP can refer them? Referral for which specialty: GI  Preferred provider/office: Dr. Allen Norris Reason for referral: pt request referral for colonoscopy

## 2021-04-23 ENCOUNTER — Other Ambulatory Visit: Payer: Self-pay | Admitting: *Deleted

## 2021-04-23 DIAGNOSIS — Z86010 Personal history of colonic polyps: Secondary | ICD-10-CM

## 2021-04-23 DIAGNOSIS — Z1211 Encounter for screening for malignant neoplasm of colon: Secondary | ICD-10-CM

## 2021-04-23 NOTE — Telephone Encounter (Signed)
Referral ordered

## 2021-05-04 ENCOUNTER — Other Ambulatory Visit: Payer: Self-pay | Admitting: Cardiovascular Disease

## 2021-05-18 NOTE — Progress Notes (Signed)
Cardiology Office Note  Date:  05/19/2021   ID:  Christopher, Cervantes Jun 05, 1956, MRN [id number]  PCP:  Christopher Cervantes., MD   Chief Complaint  Patient presents with   12 month follow up     "Doing well." Medications reviewed by the patient verbally.      HPI:  Christopher Cervantes is a 65 year-old gentleman with past medical history of  hypertension,  hyperlipidemia,  irritable bowel syndrome  prostate issues,  palpitations,  Alcohol abuse who presents for evaluation of his hypertension and hyperlipidemia  LOV 04/2020 In follow-up today reports he feels well Weight running higher Feels blood pressure running high at home BP at home: 135/85 and higher  Weight higher,Still active, exercising daily  Nonsmoker  Previous records reviewed, drinking 4-5 beers a day at times several years ago Recently visiting his mother up in [place] Prior hx of ETOH  Denies any shortness of breath or chest pain on exertion  Total cholesterol 250, previously did not want a statin Reports taking red yeast rice  Previously on valsartan and amlodipine combo pill Change secondary to cost At that time blood pressure was doing well several years ago Now only on losartan 100   EKG personally reviewed by myself on todays visit Shows normal sinus rhythm with rate 80 bpm with no significant ST or T wave changes   Other past medical history  no significant family history of coronary artery disease.  PMH:   has a past medical history of Dental bridge present, GERD (gastroesophageal reflux disease), HLD (hyperlipidemia), HTN (hypertension), and IBS (irritable bowel syndrome).  PSH:    Past Surgical History:  Procedure Laterality Date   COLONOSCOPY WITH PROPOFOL N/A 09/17/2017   Procedure: COLONOSCOPY WITH PROPOFOL;  Surgeon: Christopher Lame, MD;  Location: [facility];  Service: Endoscopy;  Laterality: N/A;   POLYPECTOMY  09/17/2017   Procedure: POLYPECTOMY INTESTINAL;  Surgeon: Christopher Lame, MD;  Location: [facility];  Service: Endoscopy;;   TONSILLECTOMY     VASECTOMY     WISDOM TOOTH EXTRACTION      Current Outpatient Medications on File Prior to Visit  Medication Sig Dispense Refill   CALCIUM-VITAMIN D PO Take 600 mg by mouth daily.     Cholecalciferol (VITAMIN D3 PO) Take by mouth.     Cyanocobalamin (VITAMIN B-12 PO) Take by mouth.     esomeprazole (NEXIUM) 10 MG packet Take 10 mg by mouth daily before breakfast.     HYDROcodone-homatropine (HYCODAN) 5-1.5 MG/5ML syrup Take 5 mLs by mouth every 6 (six) hours as needed for cough. 120 mL 0   Omega-3 Fatty Acids (FISH OIL PO) Take by mouth.     Probiotic Product (SOLUBLE FIBER/PROBIOTICS PO) Take by mouth daily.     Red Yeast Rice 600 MG CAPS Take 2 capsules by mouth every other day. Reported on 11/19/2015     Zinc 50 MG CAPS Take 50 mg by mouth daily.     amoxicillin-clavulanate (AUGMENTIN) 875-125 MG tablet Take 1 tablet by mouth 2 (two) times daily. (Patient not taking: No sig reported) 20 tablet 0   Coenzyme Q10 (COQ10 PO) Take by mouth. (Patient not taking: No sig reported)     finasteride (PROSCAR) 5 MG tablet Take 5 mg by mouth daily. (Patient not taking: No sig reported)     [DISCONTINUED] lisinopril (PRINIVIL,ZESTRIL) 40 MG tablet TAKE 1 TABLET(40 MG) BY MOUTH DAILY 30 tablet 0   No current facility-administered medications on file prior  to visit.     Allergies:   Lactose, Lactose intolerance (gi), Shellfish allergy, and Wheat bran   Social History:  The patient  reports that he quit smoking about 39 years ago. His smoking use included cigarettes. He has never used smokeless tobacco. He reports current alcohol use of about 2.0 standard drinks per week. He reports that he does not use drugs.   Family History:   family history includes Cancer in his father; Hyperlipidemia in his brother, mother, and sister; Hypertension in his brother, mother, and sister.    Review of Systems: Review of Systems   Constitutional: Negative.   Respiratory: Negative.    Cardiovascular: Negative.   Gastrointestinal: Negative.   Musculoskeletal: Negative.   Neurological: Negative.   Psychiatric/Behavioral: Negative.    All other systems reviewed and are negative.  PHYSICAL EXAM: VS:  BP (!) 154/90 (BP Location: Left Arm, Patient Position: Sitting, Cuff Size: Normal)   Pulse (!) 106   Ht 6' (1.829 m)   Wt 208 lb (94.3 kg)   SpO2 98%   BMI 28.21 kg/m  , BMI Body mass index is 28.21 kg/m. Constitutional:  oriented to person, place, and time. No distress.  HENT:  Head: Grossly normal Eyes:  no discharge. No scleral icterus.  Neck: No JVD, no carotid bruits  Cardiovascular: Regular rate and rhythm, no murmurs appreciated Pulmonary/Chest: Clear to auscultation bilaterally, no wheezes or rails Abdominal: Soft.  no distension.  no tenderness.  Musculoskeletal: Normal range of motion Neurological:  normal muscle tone. Coordination normal. No atrophy Skin: Skin warm and dry Psychiatric: normal affect, pleasant  Recent Labs: 07/12/2020: ALT 53; BUN 12; Creatinine, Ser 0.93; Hemoglobin 12.8; Platelets 195; Potassium 4.0; Sodium 136; TSH 2.560    Lipid Panel Lab Results  Component Value Date   CHOL 256 (H) 07/12/2020   HDL 112 07/12/2020   LDLCALC 134 (H) 07/12/2020   TRIG 63 07/12/2020      Wt Readings from Last 3 Encounters:  05/19/21 208 lb (94.3 kg)  04/15/20 208 lb (94.3 kg)  02/08/20 208 lb (94.3 kg)     ASSESSMENT AND PLAN:  Benign essential HTN - Plan: EKG 12-Lead Currently taking amlodipine 5 mg daily with his losartan 100 Pressure high, Increase amlodipine to 10 daily Recommend he call us if blood pressure continues to run high  Mixed hyperlipidemia - Plan: EKG 12-Lead Prefers no statin CT coronary ordered  Cardiovascular screening Discussed CT coronary calcium scoring, details provided Order placed today  ETOH Cessation    Total encounter time more than 25  minutes  Greater than 50% was spent in counseling and coordination of care with the patient    Orders Placed This Encounter  Procedures   CT CARDIAC SCORING (SELF PAY ONLY)   EKG 12-Lead      Signed, Christopher Cervantes, M.D., Ph.D. 05/19/2021  [place], [place]

## 2021-05-19 ENCOUNTER — Ambulatory Visit (INDEPENDENT_AMBULATORY_CARE_PROVIDER_SITE_OTHER): Payer: 59 | Admitting: Cardiovascular Disease

## 2021-05-19 ENCOUNTER — Encounter: Payer: Self-pay | Admitting: Cardiovascular Disease

## 2021-05-19 ENCOUNTER — Other Ambulatory Visit: Payer: Self-pay

## 2021-05-19 VITALS — BP 154/90 | HR 106 | Ht 72.0 in | Wt 208.0 lb

## 2021-05-19 DIAGNOSIS — I10 Essential (primary) hypertension: Secondary | ICD-10-CM | POA: Diagnosis not present

## 2021-05-19 DIAGNOSIS — F1010 Alcohol abuse, uncomplicated: Secondary | ICD-10-CM | POA: Diagnosis not present

## 2021-05-19 DIAGNOSIS — Z83438 Family history of other disorder of lipoprotein metabolism and other lipidemia: Secondary | ICD-10-CM | POA: Diagnosis not present

## 2021-05-19 DIAGNOSIS — E782 Mixed hyperlipidemia: Secondary | ICD-10-CM

## 2021-05-19 MED ORDER — LOSARTAN POTASSIUM 100 MG PO TABS: 100.0000 mg | ORAL_TABLET | Freq: Every day | ORAL | 3 refills | Status: DC

## 2021-05-19 MED ORDER — AMLODIPINE BESYLATE 10 MG PO TABS: 10.0000 mg | ORAL_TABLET | Freq: Every day | ORAL | 3 refills | Status: DC

## 2021-05-19 NOTE — Patient Instructions (Addendum)
Medication Instructions:  Amlodipine up to 10 mg daily  If you need a refill on your cardiac medications before your next appointment, please call your pharmacy.   Lab work: No new labs needed  Testing/Procedures: We have order a CT coronary calcium score (you may see results on your MyChart account, but we will call via phone with the results) This is a $99 out of pocket expense   This procedure uses special x-ray equipment to produce pictures of the coronary arteries to determine if they are blocked or narrowed by the buildup of plaque - an indicator for atherosclerosis or coronary artery disease (CAD).  Please call [phone] to schedule at your earliest convince   This is done at our [facility] in [facility] [place], [place] [address]    Follow-Up: At [facility], you and your health needs are our priority.  As part of our continuing mission to provide you with exceptional heart care, we have created designated Provider Care Teams.  These Care Teams include your primary Cardiologist (physician) and Advanced Practice Providers (APPs -  Physician Assistants and Nurse Practitioners) who all work together to provide you with the care you need, when you need it.  You will need a follow up appointment in 12 months  Providers on your designated Care Team:   Murray Hodgkins, NP Christell Faith, PA-C Marrianne Mood, PA-C Cadence [place], [place]  COVID-19 Vaccine Information can be found at: ShippingScam.co.uk For questions related to vaccine distribution or appointments, please email [email or web address] or call [phone].

## 2021-06-04 ENCOUNTER — Ambulatory Visit: Payer: 59 | Admitting: Gastroenterology

## 2021-08-18 ENCOUNTER — Ambulatory Visit: Payer: Self-pay | Admitting: *Deleted

## 2021-08-18 NOTE — Telephone Encounter (Signed)
  Chief Complaint: Left ankle pain for the last 2 months.  He is requesting an x ray since it's still swollen. Symptoms: Mild swelling and pain Frequency: constant Pertinent Negatives: Patient denies not being able to walk on it. Disposition: [] ED /[] Urgent Care (no appt availability in office) / [x] Appointment(In office/virtual)/ []  [facility]/ [] Home Care/ [] Refused Recommended Disposition  Additional Notes: Called into [facility] and got him scheduled with Tally Joe, NP for 08/22/2021 by Gari Crown.  Pt agreeable to this appt.
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
## 2021-08-22 ENCOUNTER — Ambulatory Visit
Admission: RE | Admit: 2021-08-22 | Discharge: 2021-08-22 | Disposition: A | Discharge status: Home / Self Care | Payer: 59 | Source: Ambulatory Visit | Attending: Family Medicine | Admitting: Family Medicine

## 2021-08-22 ENCOUNTER — Encounter: Payer: Self-pay | Admitting: Family Medicine

## 2021-08-22 ENCOUNTER — Other Ambulatory Visit: Payer: Self-pay

## 2021-08-22 ENCOUNTER — Ambulatory Visit (INDEPENDENT_AMBULATORY_CARE_PROVIDER_SITE_OTHER): Payer: 59 | Admitting: Family Medicine

## 2021-08-22 ENCOUNTER — Ambulatory Visit
Admission: RE | Admit: 2021-08-22 | Discharge: 2021-08-22 | Disposition: A | Discharge status: Home / Self Care | Payer: 59 | Attending: Family Medicine | Admitting: Family Medicine

## 2021-08-22 VITALS — BP 130/80 | HR 85 | Resp 16 | Wt 213.2 lb

## 2021-08-22 DIAGNOSIS — S99912S Unspecified injury of left ankle, sequela: Secondary | ICD-10-CM | POA: Insufficient documentation

## 2021-08-22 DIAGNOSIS — F419 Anxiety disorder, unspecified: Secondary | ICD-10-CM | POA: Diagnosis not present

## 2021-08-22 DIAGNOSIS — M25572 Pain in left ankle and joints of left foot: Secondary | ICD-10-CM

## 2021-08-22 DIAGNOSIS — M25472 Effusion, left ankle: Secondary | ICD-10-CM | POA: Diagnosis present

## 2021-08-22 DIAGNOSIS — M25579 Pain in unspecified ankle and joints of unspecified foot: Secondary | ICD-10-CM

## 2021-08-22 DIAGNOSIS — I10 Essential (primary) hypertension: Secondary | ICD-10-CM

## 2021-08-22 NOTE — Assessment & Plan Note (Signed)
Increased pain on exam seen with plantar flexion

## 2021-08-22 NOTE — Assessment & Plan Note (Signed)
Wax/wanes Has been icing for 15 mins with use of sleeve Intermittent pain since event chasing car in 10/22

## 2021-08-22 NOTE — Assessment & Plan Note (Signed)
Chronic concern; worse when seen in PCP/MD office HR lower per pt report out of office Continue to recommend stress/anxiety management techniques during routine visits
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
## 2021-08-22 NOTE — Assessment & Plan Note (Signed)
Chronic, elevated today in office Home/gym reading within goal Pt reports daily exercise 3-4 miles on elliptical

## 2021-08-25 ENCOUNTER — Other Ambulatory Visit: Payer: Self-pay | Admitting: Family Medicine

## 2021-08-25 DIAGNOSIS — S93402S Sprain of unspecified ligament of left ankle, sequela: Secondary | ICD-10-CM

## 2021-10-04 IMAGING — CT CT HEAD W/O CM
1 series · 16 of 30 positions shown, 20 images · non-contrast
Comparison: MRI head 03/12/2016.

CLINICAL DATA: Headache, right side for 3-4 months. No history of
cancer or surgery.

EXAM:
CT HEAD WITHOUT CONTRAST
TECHNIQUE: Contiguous axial images were obtained from the base of the skull
through the vertex without intravenous contrast.

[Series 2: head wo · axial · 0.45mm/px · z∈[-115,+25]mm · 16 of 32 slices shown, 20 images]
[im 2/32  brain]
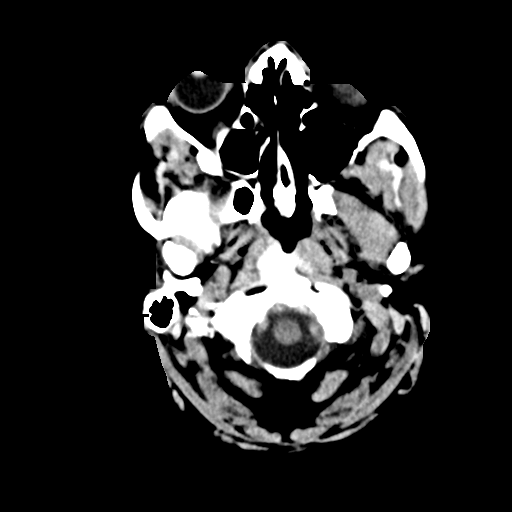
[im 2/32  bone]
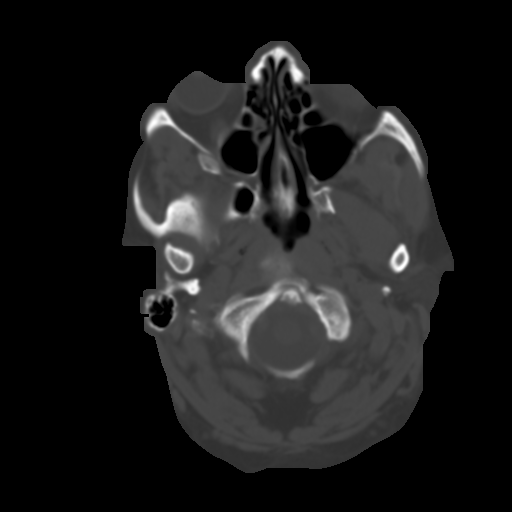
[im 4/32  brain]
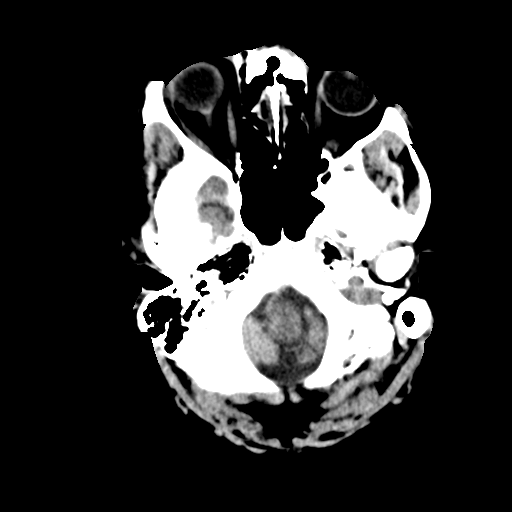
[im 6/32  brain]
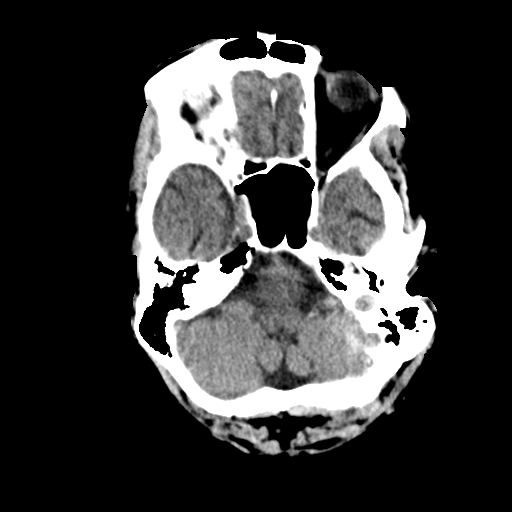
[im 8/32  brain]
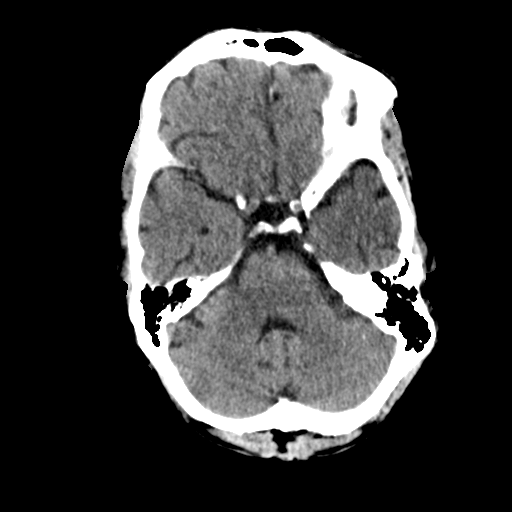
[im 9/32  brain]
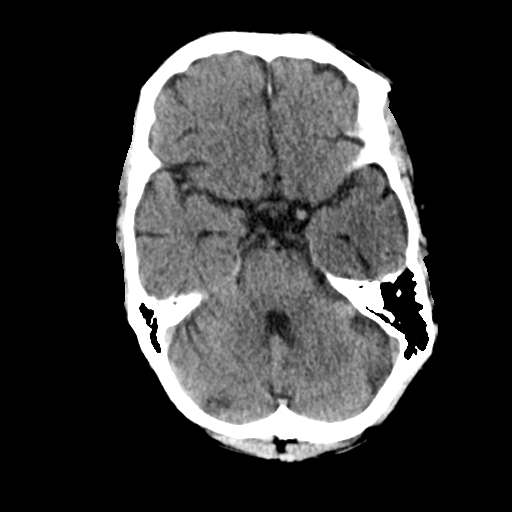
[im 9/32  bone]
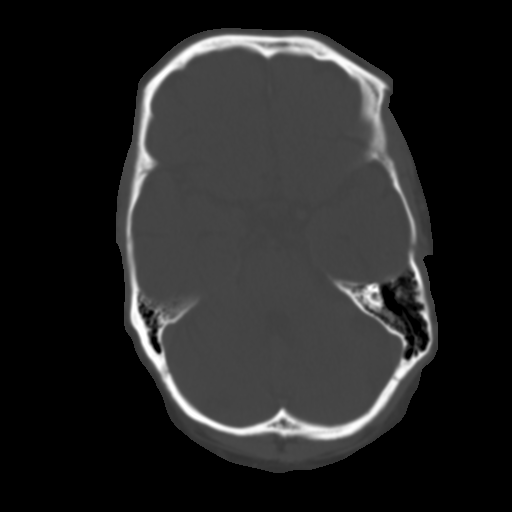
[im 11/32  brain]
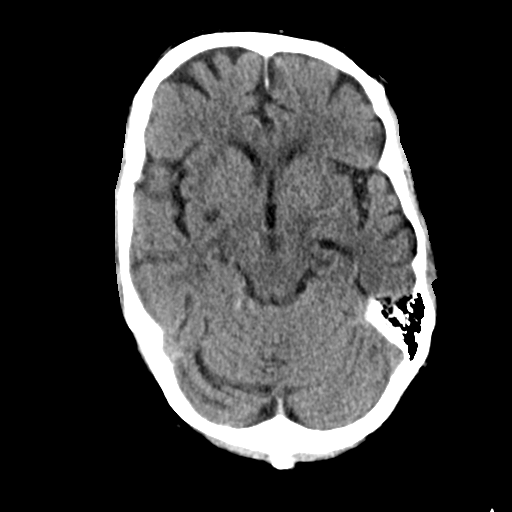
[im 13/32  brain]
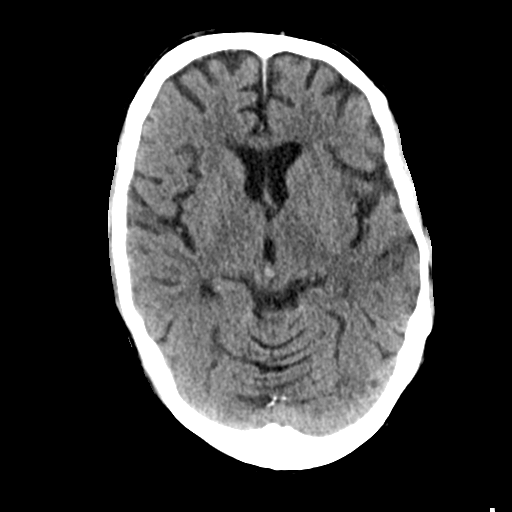
[im 15/32  brain]
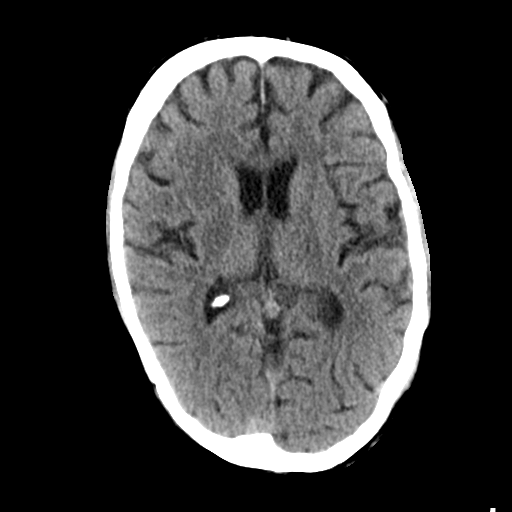
[im 17/32  brain]
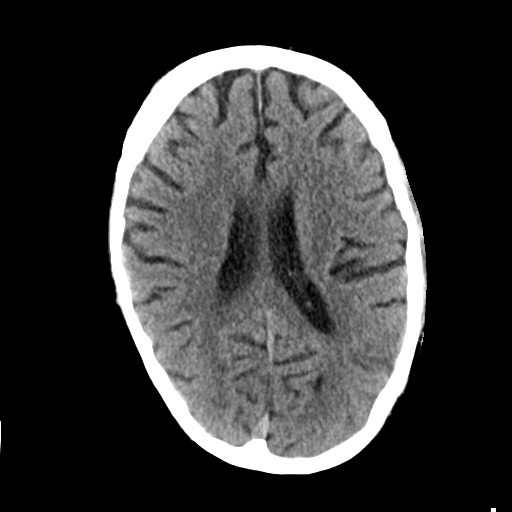
[im 17/32  bone]
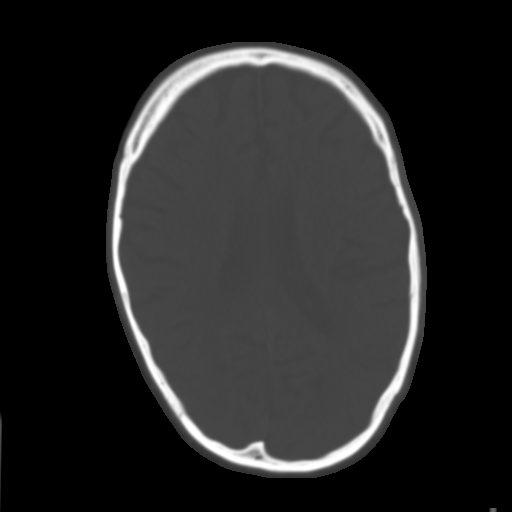
[im 19/32  brain]
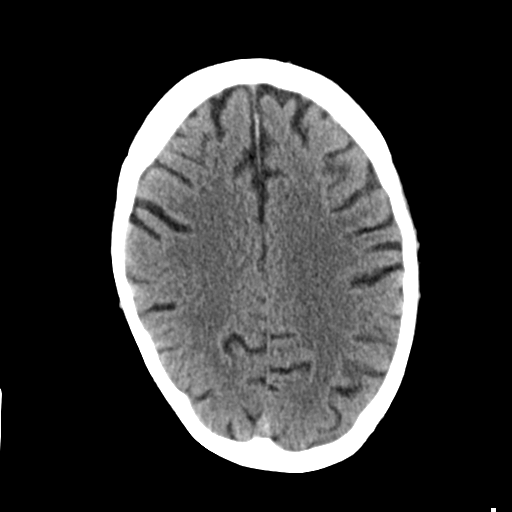
[im 21/32  brain]
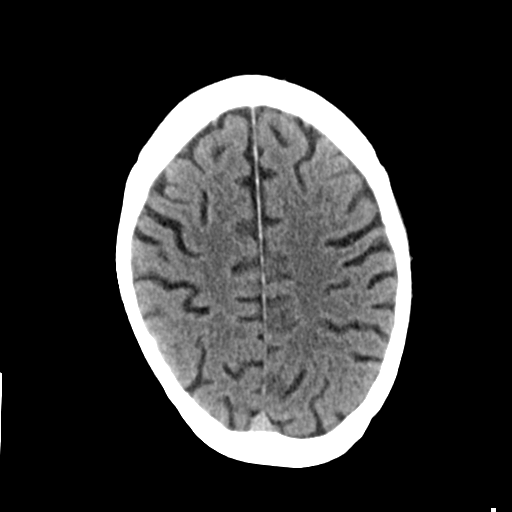
[im 23/32  brain]
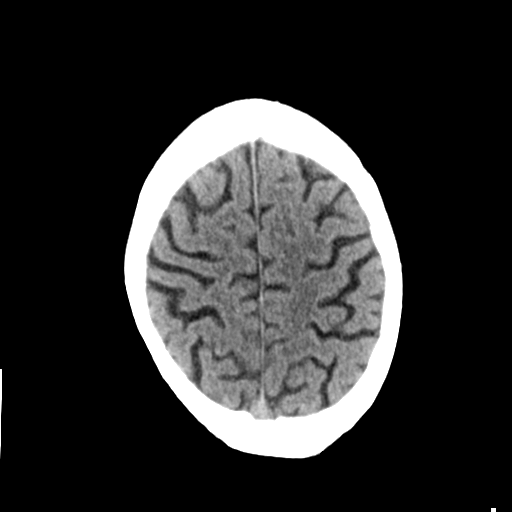
[im 24/32  brain]
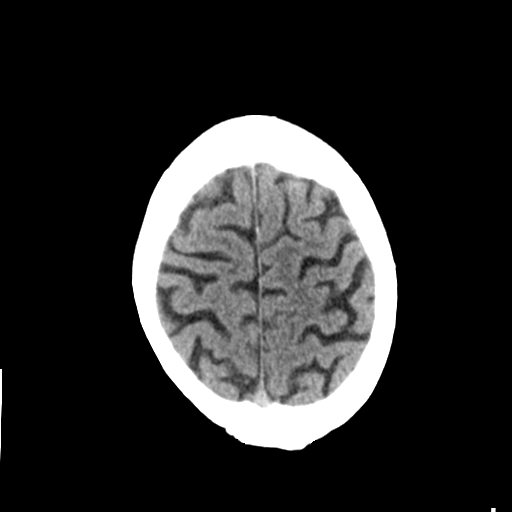
[im 24/32  bone]
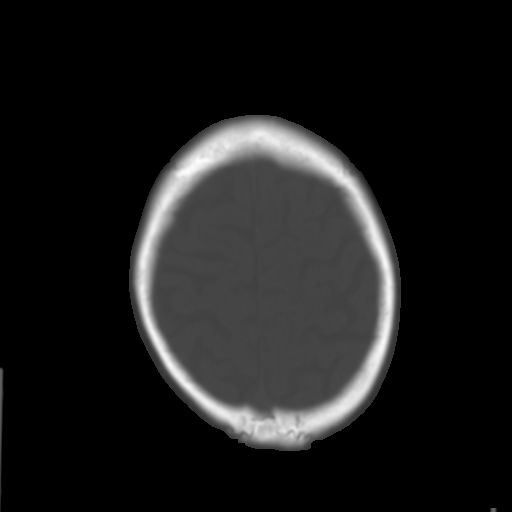
[im 26/32  brain]
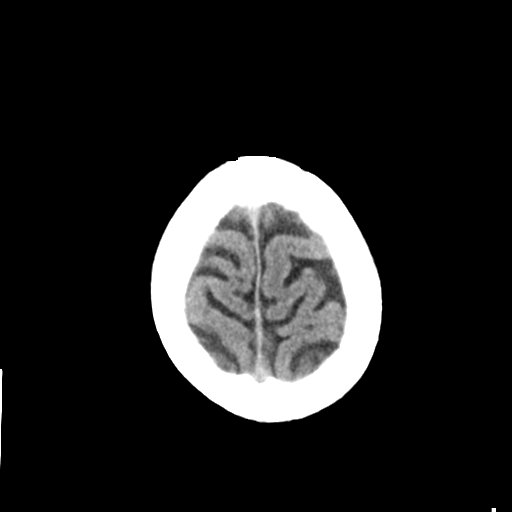
[im 28/32  brain]
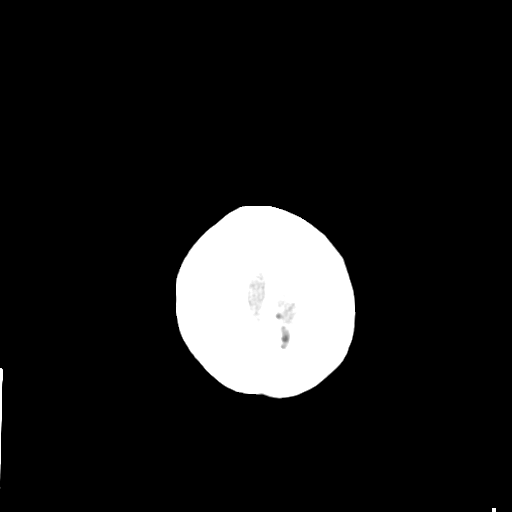
[im 30/32  brain]
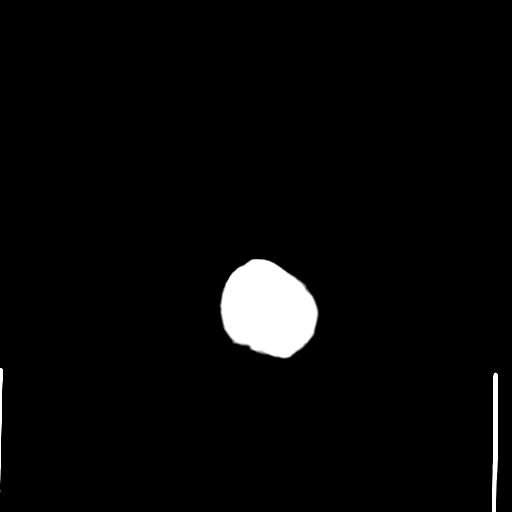

[16 of 30 positions shown; findings below may reference images not displayed]

FINDINGS: Brain:

No evidence of large-territorial acute infarction. No parenchymal
hemorrhage. No mass lesion. No extra-axial collection.

No mass effect or midline shift. No hydrocephalus. Basilar cisterns
are patent.

Vascular: No hyperdense vessel. Atherosclerotic calcifications are
present within the cavernous internal carotid arteries.

Skull: No acute fracture or focal lesion.

Sinuses/Orbits: Paranasal sinuses and mastoid air cells are clear.
The orbits are unremarkable.

Other: None.
IMPRESSION: No acute intracranial abnormality.

## 2021-11-19 DIAGNOSIS — L720 Epidermal cyst: Secondary | ICD-10-CM | POA: Diagnosis not present

## 2021-11-19 DIAGNOSIS — L578 Other skin changes due to chronic exposure to nonionizing radiation: Secondary | ICD-10-CM | POA: Diagnosis not present

## 2021-11-19 DIAGNOSIS — L2089 Other atopic dermatitis: Secondary | ICD-10-CM | POA: Diagnosis not present

## 2022-02-20 DIAGNOSIS — M5412 Radiculopathy, cervical region: Secondary | ICD-10-CM | POA: Diagnosis not present

## 2022-03-09 ENCOUNTER — Telehealth: Payer: Self-pay

## 2022-03-09 NOTE — Telephone Encounter (Signed)
Copied from [place] [id number]. Topic: Appointment Scheduling - Scheduling Inquiry for Clinic >> Mar 09, 2022 10:00 AM Tiffany B wrote: Reason for CRM: patient is scheduled with PA 03/11/2022 at 10 am. Patient declined appointment with PCP. Caller states depending on how the appointment goes with PA may want to transfer care to PA.

## 2022-03-09 NOTE — Telephone Encounter (Signed)
Noted  

## 2022-03-11 ENCOUNTER — Encounter: Payer: Self-pay | Admitting: Physician Assistant

## 2022-03-11 ENCOUNTER — Ambulatory Visit (INDEPENDENT_AMBULATORY_CARE_PROVIDER_SITE_OTHER): Payer: 59 | Admitting: Physician Assistant

## 2022-03-11 ENCOUNTER — Encounter: Payer: Self-pay | Admitting: Family Medicine

## 2022-03-11 VITALS — BP 152/73 | HR 109 | Temp 98.0°F | Resp 16 | Ht 72.0 in | Wt 209.8 lb

## 2022-03-11 DIAGNOSIS — E782 Mixed hyperlipidemia: Secondary | ICD-10-CM

## 2022-03-11 DIAGNOSIS — M2550 Pain in unspecified joint: Secondary | ICD-10-CM | POA: Diagnosis not present

## 2022-03-11 DIAGNOSIS — Z807 Family history of other malignant neoplasms of lymphoid, hematopoietic and related tissues: Secondary | ICD-10-CM

## 2022-03-11 DIAGNOSIS — F419 Anxiety disorder, unspecified: Secondary | ICD-10-CM | POA: Diagnosis not present

## 2022-03-11 DIAGNOSIS — I10 Essential (primary) hypertension: Secondary | ICD-10-CM | POA: Diagnosis not present

## 2022-03-11 DIAGNOSIS — R69 Illness, unspecified: Secondary | ICD-10-CM | POA: Diagnosis not present

## 2022-03-11 NOTE — Progress Notes (Signed)
I,[name],acting as a Education administrator for [employer], PA-C.,have documented all relevant documentation on the behalf of Christopher Speak, PA-C,as directed by  [employer], PA-C while in the presence of [employer], PA-C.    Established patient visit   Patient: Christopher Cervantes   DOB: Dec 08, 1955   66 y.o. Male  MRN: [id number] Visit Date: 03/11/2022  Today's healthcare provider: [name], PA-C   Chief Complaint  Patient presents with   Joint Pain   Subjective    Arthritis Presents for initial visit. The disease course has been fluctuating. He complains of joint swelling. Affected locations include the right shoulder, left shoulder, right knee and left knee. Pertinent negatives include no fatigue, pain at night or pain while resting. His family medical history includes family history of rheumatoid arthritis. Past treatments include NSAIDs. The treatment provided no relief. Factors aggravating his arthritis include lifting.    Patient was seen by [facility] and had xray done and showed some inflammation on right shoulder. He was referred to Rheumatology by [facility] to see Dr. Posey Pronto.Patient had some labs done but would like other labs as well. Patient denies having bone pain or weakness in his arms and legs and/or a sensation of numbness in your arms and legs.  Pt is very active: doing intense workout /gym, every day for 40 years, and full court basketball for the 20 years  Medications: Outpatient Medications Prior to Visit  Medication Sig   amLODipine (NORVASC) 10 MG tablet Take 1 tablet (10 mg total) by mouth daily.   CALCIUM-VITAMIN D PO Take 600 mg by mouth daily.   Cholecalciferol (VITAMIN D3 PO) Take by mouth.   Coenzyme Q10 (COQ10 PO) Take by mouth.   Cyanocobalamin (VITAMIN B-12 PO) Take by mouth.   esomeprazole (NEXIUM) 10 MG packet Take 10 mg by mouth daily before breakfast.   finasteride (PROSCAR) 5 MG tablet Take 5 mg by mouth daily.   losartan (COZAAR) 100  MG tablet Take 1 tablet (100 mg total) by mouth daily.   Omega-3 Fatty Acids (FISH OIL PO) Take by mouth.   Probiotic Product (SOLUBLE FIBER/PROBIOTICS PO) Take by mouth daily.   Red Yeast Rice 600 MG CAPS Take 2 capsules by mouth every other day. Reported on 11/19/2015   Zinc 50 MG CAPS Take 50 mg by mouth daily.   HYDROcodone-homatropine (HYCODAN) 5-1.5 MG/5ML syrup Take 5 mLs by mouth every 6 (six) hours as needed for cough. (Patient not taking: Reported on 03/11/2022)   No facility-administered medications prior to visit.    Review of Systems  Constitutional:  Negative for fatigue.  Musculoskeletal:  Positive for arthritis and joint swelling.  See HPI       Objective    BP (!) 152/73 (BP Location: Left Arm, Patient Position: Sitting, Cuff Size: Normal)   Pulse (!) 109   Temp 98 F (36.7 C) (Oral)   Resp 16   Ht 6' (1.829 m)   Wt 209 lb 12.8 oz (95.2 kg)   BMI 28.45 kg/m    Physical Exam Vitals reviewed.  Constitutional:      General: He is not in acute distress.    Appearance: Normal appearance. He is not diaphoretic.  HENT:     Head: Normocephalic and atraumatic.     Right Ear: Tympanic membrane, ear canal and external ear normal.     Left Ear: Tympanic membrane, ear canal and external ear normal.     Nose: Congestion and rhinorrhea present.  Eyes:     General: No scleral icterus.    Pupils: Pupils are equal, round, and reactive to light.  Cardiovascular:     Rate and Rhythm: Normal rate and regular rhythm.     Pulses: Normal pulses.     Heart sounds: Normal heart sounds. No murmur heard. Pulmonary:     Effort: Pulmonary effort is normal. No respiratory distress.     Breath sounds: Normal breath sounds. No wheezing or rhonchi.  Abdominal:     General: Abdomen is flat. Bowel sounds are normal.     Palpations: Abdomen is soft.  Musculoskeletal:        General: No swelling or tenderness.     Cervical back: Normal range of motion and neck supple.     Right lower  leg: No edema.     Left lower leg: No edema.  Lymphadenopathy:     Cervical: No cervical adenopathy.  Skin:    General: Skin is warm and dry.     Capillary Refill: Capillary refill takes less than 2 seconds.     Findings: No rash.  Neurological:     General: No focal deficit present.     Mental Status: He is alert and oriented to person, place, and time. Mental status is at baseline.     Cranial Nerves: No cranial nerve deficit.     Motor: No weakness.     Coordination: Coordination normal.     Gait: Gait normal.     Deep Tendon Reflexes: Reflexes normal.  Psychiatric:        Mood and Affect: Mood normal.        Behavior: Behavior normal.     No results found for any visits on 03/11/22.  Assessment & Plan     1. Benign essential HTN Chronic and stable. Continue to take amlodipine and losartan/max Recommended to measure his BP and bring his BP log with him to the next appt Fu in 1 mo  2. Mixed hyperlipidemia Chronic and stable Continue Red rice yeast  Lipid panel ordered  3. Arthralgia, unspecified joint Chronic Negative workup for RA: RF is 10, anti-CCP ab IGG/IGA is 3, uric acid 5/1, antistreptolysin AB 81, C reactive protein 7 Pt brought the lab work from a different clinic Pt was scheduled with rheumatology by Dr. Earnestine Leys  4. Family history of multiple myeloma Asymptomatic? Will start with CMP Depending on results we Might proceed with MM workup/protein electrophoresis which might take up to 3 weeks  5. Anxiety Panic attacks/self-diagnosed Advised relaxation techniques, tapping Might Rx buspirone Pt tried xanax - which was helpful Was tried on SSRIs - without much relief Might refer to [facility] for evaluation if pt prefers   FU in 1 mo The patient was advised to call back or seek an in-person evaluation if the symptoms worsen or if the condition fails to improve as anticipated.  I discussed the assessment and treatment plan with the patient. The  patient was provided an opportunity to ask questions and all were answered. The patient agreed with the plan and demonstrated an understanding of the instructions.  The entirety of the information documented in the History of Present Illness, Review of Systems and Physical Exam were personally obtained by me. Portions of this information were initially documented by the CMA and reviewed by me for thoroughness and accuracy.  Portions of this note were created using dictation software and may contain typographical errors.   Christopher Speak, PA-C  [employer] [phone] (  phone) [phone] (fax)  [place]

## 2022-03-12 ENCOUNTER — Other Ambulatory Visit: Payer: Self-pay

## 2022-03-12 ENCOUNTER — Other Ambulatory Visit: Payer: Self-pay | Admitting: Physician Assistant

## 2022-03-12 DIAGNOSIS — F419 Anxiety disorder, unspecified: Secondary | ICD-10-CM

## 2022-03-12 LAB — CBC WITH DIFFERENTIAL/PLATELET
Basophils Absolute: 0 10*3/uL (ref 0.0–0.2)
Basos: 1 %
EOS (ABSOLUTE): 0 10*3/uL (ref 0.0–0.4)
Eos: 0 %
Hematocrit: 38.9 % (ref 37.5–51.0)
Hemoglobin: 13.3 g/dL (ref 13.0–17.7)
Immature Grans (Abs): 0.3 10*3/uL — ABNORMAL HIGH (ref 0.0–0.1)
Immature Granulocytes: 4 %
Lymphocytes Absolute: 1.1 10*3/uL (ref 0.7–3.1)
Lymphs: 16 %
MCH: 34 pg — ABNORMAL HIGH (ref 26.6–33.0)
MCHC: 34.2 g/dL (ref 31.5–35.7)
MCV: 100 fL — ABNORMAL HIGH (ref 79–97)
Monocytes Absolute: 0.9 10*3/uL (ref 0.1–0.9)
Monocytes: 13 %
Neutrophils Absolute: 4.7 10*3/uL (ref 1.4–7.0)
Neutrophils: 66 %
Platelets: 213 10*3/uL (ref 150–450)
RBC: 3.91 x10E6/uL — ABNORMAL LOW (ref 4.14–5.80)
RDW: 13.5 % (ref 11.6–15.4)
WBC: 7 10*3/uL (ref 3.4–10.8)

## 2022-03-12 LAB — COMPREHENSIVE METABOLIC PANEL
ALT: 42 IU/L (ref 0–44)
AST: 58 IU/L — ABNORMAL HIGH (ref 0–40)
Albumin/Globulin Ratio: 1.6 (ref 1.2–2.2)
Albumin: 5.4 g/dL — ABNORMAL HIGH (ref 3.8–4.8)
Alkaline Phosphatase: 55 IU/L (ref 44–121)
BUN/Creatinine Ratio: 16 (ref 10–24)
BUN: 17 mg/dL (ref 8–27)
Bilirubin Total: 0.6 mg/dL (ref 0.0–1.2)
CO2: 24 mmol/L (ref 20–29)
Calcium: 9.9 mg/dL (ref 8.6–10.2)
Chloride: 97 mmol/L (ref 96–106)
Creatinine, Ser: 1.04 mg/dL (ref 0.76–1.27)
Globulin, Total: 3.4 g/dL (ref 1.5–4.5)
Glucose: 130 mg/dL — ABNORMAL HIGH (ref 70–99)
Potassium: 4.3 mmol/L (ref 3.5–5.2)
Sodium: 140 mmol/L (ref 134–144)
Total Protein: 8.8 g/dL — ABNORMAL HIGH (ref 6.0–8.5)
eGFR: 81 mL/min/{1.73_m2} (ref >59)

## 2022-03-12 LAB — LIPID PANEL
Chol/HDL Ratio: 2.1 ratio (ref 0.0–5.0)
Cholesterol, Total: 269 mg/dL — ABNORMAL HIGH (ref 100–199)
HDL: 128 mg/dL (ref >39)
LDL Chol Calc (NIH): 122 mg/dL — ABNORMAL HIGH (ref 0–99)
Triglycerides: 116 mg/dL (ref 0–149)
VLDL Cholesterol Cal: 19 mg/dL (ref 5–40)

## 2022-03-12 NOTE — Telephone Encounter (Signed)
Copied from [place] [id number]. Topic: General - Other >> Mar 12, 2022  9:38 AM [place] B wrote: Reason for WAQ:[id number] wife, Ivin Booty called in states, from converssation patient had with Dr Rosanna Randy, he does want to try the buspirone/buspar, medication and have it sent to ,[facility], [place] - [address]  Phone:  [phone] Fax:  [phone]

## 2022-03-13 MED ORDER — BUSPIRONE HCL 7.5 MG PO TABS: 7.5000 mg | ORAL_TABLET | Freq: Two times a day (BID) | ORAL | 0 refills | Status: DC

## 2022-03-13 NOTE — Progress Notes (Signed)
[name] ,   Your labwork results all are within normal limits and stable.  Except an increase in cholesterol levels. Lifestyle modifications via low-carb, low-fat, low-calorie diet recommended.  You might have anemia with macrocytosis and increased serum protein.  If you will approve, I could refer you to Hematology for evaluation including for Multiple Myeloma. Your liver enzyme is elevated as well. Strongly encouraged weight loss /abdominal obesity via healthy diet and regular exercise  Any questions please reach out to the office or message me on MyChart!  Best, [name], PA-C

## 2022-03-16 ENCOUNTER — Other Ambulatory Visit: Payer: Self-pay | Admitting: Physician Assistant

## 2022-03-16 DIAGNOSIS — Z807 Family history of other malignant neoplasms of lymphoid, hematopoietic and related tissues: Secondary | ICD-10-CM

## 2022-03-16 MED ORDER — BUSPIRONE HCL 7.5 MG PO TABS: 7.5000 mg | ORAL_TABLET | Freq: Two times a day (BID) | ORAL | 0 refills | Status: DC

## 2022-03-16 NOTE — Telephone Encounter (Signed)
Resending to requested pharmacy.  Requested Prescriptions  Pending Prescriptions Disp Refills  . busPIRone (BUSPAR) 7.5 MG tablet 60 tablet 0    Sig: Take 1 tablet (7.5 mg total) by mouth 2 (two) times daily.     Psychiatry: Anxiolytics/Hypnotics - Non-controlled Passed - 03/16/2022 10:17 AM      Passed - Valid encounter within last 12 months    Recent Outpatient Visits          5 days ago Benign essential HTN   [facility], [place], PA-C   6 months ago Pain and swelling of ankle, left   [facility] Gwyneth Sprout, FNP   1 year ago [place] Jerrol Banana., MD   1 year ago Chronic sinusitis, unspecified location   [facility] Jerrol Banana., MD   2 years ago Annual physical exam   [facility] Jerrol Banana., MD      Future Appointments            In 1 month Jerrol Banana., MD [facility]           Signed Prescriptions Disp Refills   busPIRone (BUSPAR) 7.5 MG tablet 60 tablet 0    Sig: Take 1 tablet (7.5 mg total) by mouth 2 (two) times daily.     There is no refill protocol information for this order

## 2022-03-16 NOTE — Telephone Encounter (Signed)
Pt wife is calling to see if the pharmacy can be changing [facility] [address].

## 2022-03-19 MED ORDER — BUSPIRONE HCL 7.5 MG PO TABS: 7.5000 mg | ORAL_TABLET | Freq: Two times a day (BID) | ORAL | 0 refills | Status: AC

## 2022-03-30 NOTE — Telephone Encounter (Signed)
Rx sent 

## 2022-03-31 ENCOUNTER — Telehealth: Payer: Self-pay | Admitting: Family Medicine

## 2022-03-31 NOTE — Telephone Encounter (Signed)
Pt was prescribed medication for anxiety / pt wants to get an RX for as needed / pt was prescribed Buspirone for everyday but he wants to try Clonazepam for as needed / please advise and send to [facility] [place]

## 2022-04-02 ENCOUNTER — Ambulatory Visit: Payer: Self-pay | Admitting: *Deleted

## 2022-04-02 NOTE — Telephone Encounter (Signed)
Opened this in error

## 2022-04-02 NOTE — Telephone Encounter (Signed)
Reason for Disposition  [1] Caller has URGENT medicine question about med that PCP or specialist prescribed AND [2] triager unable to answer question    See ongoing conversation in chart regarding anti anxiety medication  Answer Assessment - Initial Assessment Questions 1. NAME of MEDICINE: "What medicine(s) are you calling about?"     Pt is requesting Klonopin  I read the message to him from [name], PA-C dated 04/02/2022 at 12:21 PM.     2. QUESTION: "What is your question?" (e.g., double dose of medicine, side effect)     He has a rheumatology and cancer dr. Appt coming up.   He is requesting something to take as needed for anxiety for these appts and tests.   He does not want an antidepressant "They don't work for me".   And he doesn't want the Buspar because he doesn't want to take something continuously.  Would Janna be willing to prescribe Klonopin for him on an as needed basis.    3. PRESCRIBER: "Who prescribed the medicine?" Reason: if prescribed by specialist, call should be referred to that group.     [name], PA-C is who he is seeing. 4. SYMPTOMS: "Do you have any symptoms?" If Yes, ask: "What symptoms are you having?"  "How bad are the symptoms (e.g., mild, moderate, severe)     Anxiety when I go to the dr. And going for tests. 5. PREGNANCY:  "Is there any chance that you are pregnant?" "When was your last menstrual period?"     N/A  Protocols used: Medication Question Call-A-AH

## 2022-04-02 NOTE — Telephone Encounter (Signed)
[place] for [facility] triage to advise.

## 2022-04-03 NOTE — Telephone Encounter (Signed)
Left 2nd message to return call

## 2022-04-03 NOTE — Telephone Encounter (Signed)
Pt does not want to see [facility] / no referral needed.  He states he would like to have [place] as his PCP and requested that I cancel his upcoming appt with Dr. Rosanna Randy for next month.  This has been done.  Please advise on the change of PCP.

## 2022-04-06 ENCOUNTER — Ambulatory Visit: Payer: Self-pay | Admitting: Oncology

## 2022-04-06 ENCOUNTER — Other Ambulatory Visit: Payer: Self-pay

## 2022-04-06 DIAGNOSIS — M2550 Pain in unspecified joint: Secondary | ICD-10-CM | POA: Diagnosis not present

## 2022-04-06 DIAGNOSIS — R682 Dry mouth, unspecified: Secondary | ICD-10-CM | POA: Diagnosis not present

## 2022-04-06 DIAGNOSIS — R69 Illness, unspecified: Secondary | ICD-10-CM | POA: Diagnosis not present

## 2022-04-06 DIAGNOSIS — R7401 Elevation of levels of liver transaminase levels: Secondary | ICD-10-CM | POA: Diagnosis not present

## 2022-04-06 DIAGNOSIS — R768 Other specified abnormal immunological findings in serum: Secondary | ICD-10-CM | POA: Diagnosis not present

## 2022-04-13 ENCOUNTER — Inpatient Hospital Stay: Payer: 59 | Attending: Oncology | Admitting: Oncology

## 2022-04-13 ENCOUNTER — Other Ambulatory Visit: Payer: Self-pay

## 2022-04-13 ENCOUNTER — Inpatient Hospital Stay: Payer: 59

## 2022-04-13 ENCOUNTER — Telehealth: Payer: Self-pay | Admitting: Cardiovascular Disease

## 2022-04-13 ENCOUNTER — Encounter: Payer: Self-pay | Admitting: Oncology

## 2022-04-13 VITALS — BP 138/91 | HR 103 | Temp 98.4°F | Resp 17 | Wt 203.0 lb

## 2022-04-13 DIAGNOSIS — R778 Other specified abnormalities of plasma proteins: Secondary | ICD-10-CM | POA: Diagnosis not present

## 2022-04-13 DIAGNOSIS — R69 Illness, unspecified: Secondary | ICD-10-CM | POA: Diagnosis not present

## 2022-04-13 DIAGNOSIS — D7589 Other specified diseases of blood and blood-forming organs: Secondary | ICD-10-CM

## 2022-04-13 DIAGNOSIS — F1010 Alcohol abuse, uncomplicated: Secondary | ICD-10-CM | POA: Insufficient documentation

## 2022-04-13 DIAGNOSIS — F17210 Nicotine dependence, cigarettes, uncomplicated: Secondary | ICD-10-CM | POA: Insufficient documentation

## 2022-04-13 LAB — CBC WITH DIFFERENTIAL/PLATELET
Abs Immature Granulocytes: 0.09 10*3/uL — ABNORMAL HIGH (ref 0.00–0.07)
Basophils Absolute: 0 10*3/uL (ref 0.0–0.1)
Basophils Relative: 1 %
Eosinophils Absolute: 0 10*3/uL (ref 0.0–0.5)
Eosinophils Relative: 0 %
HCT: 37.2 % — ABNORMAL LOW (ref 39.0–52.0)
Hemoglobin: 12.7 g/dL — ABNORMAL LOW (ref 13.0–17.0)
Immature Granulocytes: 2 %
Lymphocytes Relative: 15 %
Lymphs Abs: 0.8 10*3/uL (ref 0.7–4.0)
MCH: 35.3 pg — ABNORMAL HIGH (ref 26.0–34.0)
MCHC: 34.1 g/dL (ref 30.0–36.0)
MCV: 103.3 fL — ABNORMAL HIGH (ref 80.0–100.0)
Monocytes Absolute: 0.8 10*3/uL (ref 0.1–1.0)
Monocytes Relative: 15 %
Neutro Abs: 3.2 10*3/uL (ref 1.7–7.7)
Neutrophils Relative %: 67 %
Platelets: 239 10*3/uL (ref 150–400)
RBC: 3.6 MIL/uL — ABNORMAL LOW (ref 4.22–5.81)
RDW: 13.1 % (ref 11.5–15.5)
WBC: 4.9 10*3/uL (ref 4.0–10.5)
nRBC: 0 % (ref 0.0–0.2)

## 2022-04-13 LAB — TECHNOLOGIST SMEAR REVIEW: Plt Morphology: NORMAL

## 2022-04-13 LAB — VITAMIN B12: Vitamin B-12: 1352 pg/mL — ABNORMAL HIGH (ref 180–914)

## 2022-04-13 LAB — LACTATE DEHYDROGENASE: LDH: 149 U/L (ref 98–192)

## 2022-04-13 LAB — FOLATE: Folate: 22.4 ng/mL (ref >5.9)

## 2022-04-13 MED ORDER — AMLODIPINE BESYLATE 10 MG PO TABS: 10.0000 mg | ORAL_TABLET | Freq: Every day | ORAL | 0 refills | Status: DC

## 2022-04-13 NOTE — Telephone Encounter (Signed)
*  STAT* If patient is at the pharmacy, call can be transferred to refill team.   1. Which medications need to be refilled? (please list name of each medication and dose if known)   Disp Refills Start End   amLODipine (NORVASC) 10 MG tablet         2. Which pharmacy/location (including street and city if local pharmacy) is medication to be sent to? [id number] [place] [facility] [address]  3. Do they need a 30 day or 90 day supply? 90 day

## 2022-04-13 NOTE — Progress Notes (Signed)
Hematology/Oncology Consult note [phone] [phone]         Patient Care Team: [name], PA-C as PCP - General (Physician Assistant) Minna Merritts, MD as PCP - Cardiology (Cardiology) Minna Merritts, MD as Consulting Physician (Cardiology) Carmon Ginsberg, [place] as Referring Physician (Family Medicine) Bary Castilla, Forest Gleason, MD (General Surgery)  REFERRING PROVIDER: [name], PA-C   CHIEF COMPLAINTS/REASON FOR VISIT:  Evaluation of macrocytosis and elevated total protein  HISTORY OF PRESENTING ILLNESS:   Christopher Cervantes is a  66 y.o.  male with PMH listed below was seen in consultation at the request of  [name], PA-C  for evaluation of macrocytosis and elevated total protein  Patient reports feeling well.  He denies any constitutional symptoms.  He is active and exercises every day.  He recently stopped alcohol for 8 days.  No residual symptoms.  Patient has a family history of multiple myeloma. Recent CBC showed macrocytosis without anemia.  Elevated total protein.  Patient was referred to hematology for evaluation.  MEDICAL HISTORY:  Past Medical History:  Diagnosis Date   Dental bridge present    top right   GERD (gastroesophageal reflux disease)    HLD (hyperlipidemia)    HTN (hypertension)    IBS (irritable bowel syndrome)     SURGICAL HISTORY: Past Surgical History:  Procedure Laterality Date   COLONOSCOPY WITH PROPOFOL N/A 09/17/2017   Procedure: COLONOSCOPY WITH PROPOFOL;  Surgeon: Lucilla Lame, MD;  Location: [facility];  Service: Endoscopy;  Laterality: N/A;   POLYPECTOMY  09/17/2017   Procedure: POLYPECTOMY INTESTINAL;  Surgeon: Lucilla Lame, MD;  Location: [facility];  Service: Endoscopy;;   TONSILLECTOMY     VASECTOMY     WISDOM TOOTH EXTRACTION      SOCIAL HISTORY: Social History   Socioeconomic History   Marital status: Married    Spouse name: Not on file   Number of children: Not on  file   Years of education: Not on file   Highest education level: Not on file  Occupational History   Not on file  Tobacco Use   Smoking status: Former    Types: Cigarettes    Quit date: 12/21/1981    Years since quitting: 40.3   Smokeless tobacco: Never  Vaping Use   Vaping Use: Never used  Substance and Sexual Activity   Alcohol use: Yes    Alcohol/week: 2.0 standard drinks of alcohol    Types: 2 Glasses of wine per week    Comment:     Drug use: No   Sexual activity: Not on file  Other Topics Concern   Not on file  Social History Narrative   Not on file   Social Determinants of Health   Financial Resource Strain: Not on file  Food Insecurity: Not on file  Transportation Needs: Not on file  Physical Activity: Not on file  Stress: Not on file  Social Connections: Not on file  Intimate Partner Violence: Not on file    FAMILY HISTORY: Family History  Problem Relation Age of Onset   Hypertension Mother    Hyperlipidemia Mother    Cancer Father    Hyperlipidemia Sister    Hypertension Sister    Hyperlipidemia Brother    Hypertension Brother     ALLERGIES:  is allergic to lactose, lactose intolerance (gi), shellfish allergy, and wheat bran.  MEDICATIONS:  Current Outpatient Medications  Medication Sig Dispense Refill   CALCIUM-VITAMIN D PO Take 600 mg by  mouth daily.     Cholecalciferol (VITAMIN D3 PO) Take by mouth.     Cyanocobalamin (VITAMIN B-12 PO) Take by mouth.     esomeprazole (NEXIUM) 10 MG packet Take 10 mg by mouth daily before breakfast.     losartan (COZAAR) 100 MG tablet Take 1 tablet (100 mg total) by mouth daily. 90 tablet 3   Omega-3 Fatty Acids (FISH OIL PO) Take by mouth.     Probiotic Product (SOLUBLE FIBER/PROBIOTICS PO) Take by mouth daily.     Red Yeast Rice 600 MG CAPS Take 2 capsules by mouth every other day. Reported on 11/19/2015     Turmeric (QC TUMERIC COMPLEX PO) Take by mouth.     amLODipine (NORVASC) 10 MG tablet Take 1 tablet  (10 mg total) by mouth daily. 90 tablet 0   busPIRone (BUSPAR) 7.5 MG tablet Take 1 tablet (7.5 mg total) by mouth 2 (two) times daily. (Patient not taking: Reported on 04/13/2022) 60 tablet 0   Coenzyme Q10 (COQ10 PO) Take by mouth. (Patient not taking: Reported on 04/13/2022)     cyanocobalamin ([facility] VITAMIN B12) 1000 MCG tablet Take by mouth.     ESOMEPRAZOLE MAGNESIUM PO Take by mouth.     finasteride (PROSCAR) 5 MG tablet Take 5 mg by mouth daily. (Patient not taking: Reported on 04/13/2022)     Misc Natural Products (CANDICIDAL) CAPS Take by mouth.     NAPROXEN SODIUM PO Take by mouth.     OVER THE COUNTER MEDICATION Take by mouth.     Zinc 50 MG CAPS Take 50 mg by mouth daily. (Patient not taking: Reported on 04/13/2022)     No current facility-administered medications for this visit.    Review of Systems  Constitutional:  Negative for appetite change, chills, fatigue, fever and unexpected weight change.  HENT:   Negative for hearing loss and voice change.   Eyes:  Negative for eye problems and icterus.  Respiratory:  Negative for chest tightness, cough and shortness of breath.   Cardiovascular:  Negative for chest pain and leg swelling.  Gastrointestinal:  Negative for abdominal distention and abdominal pain.  Endocrine: Negative for hot flashes.  Genitourinary:  Negative for difficulty urinating, dysuria and frequency.   Musculoskeletal:  Negative for arthralgias.  Skin:  Negative for itching and rash.  Neurological:  Negative for light-headedness and numbness.  Hematological:  Negative for adenopathy. Does not bruise/bleed easily.  Psychiatric/Behavioral:  Negative for confusion.    PHYSICAL EXAMINATION: ECOG PERFORMANCE STATUS: 0 - Asymptomatic Vitals:   04/13/22 0939  BP: (!) 138/91  Pulse: (!) 103  Resp: 17  Temp: 98.4 F (36.9 C)  SpO2: 99%   Filed Weights   04/13/22 0939  Weight: 203 lb (92.1 kg)    Physical Exam Constitutional:      General: He is not in acute  distress. HENT:     Head: Normocephalic and atraumatic.  Eyes:     General: No scleral icterus. Cardiovascular:     Rate and Rhythm: Normal rate and regular rhythm.     Heart sounds: Normal heart sounds.  Pulmonary:     Effort: Pulmonary effort is normal. No respiratory distress.     Breath sounds: No wheezing.  Abdominal:     General: Bowel sounds are normal. There is no distension.     Palpations: Abdomen is soft.  Musculoskeletal:        General: No deformity. Normal range of motion.     Cervical back: Normal  range of motion and neck supple.  Skin:    General: Skin is warm and dry.     Findings: No erythema or rash.  Neurological:     Mental Status: He is alert and oriented to person, place, and time. Mental status is at baseline.     Cranial Nerves: No cranial nerve deficit.     Coordination: Coordination normal.  Psychiatric:        Mood and Affect: Mood normal.     LABORATORY DATA:  I have reviewed the data as listed    Latest Ref Rng & Units 04/13/2022   10:27 AM 03/11/2022   11:41 AM 07/12/2020    8:42 AM  CBC  WBC 4.0 - 10.5 K/uL 4.9  7.0  4.7   Hemoglobin 13.0 - 17.0 g/dL 12.7  13.3  12.8   Hematocrit 39.0 - 52.0 % 37.2  38.9  36.0   Platelets 150 - 400 K/uL 239  213  195       Latest Ref Rng & Units 03/11/2022   11:41 AM 07/12/2020    8:42 AM 01/04/2017    9:57 AM  CMP  Glucose 70 - 99 mg/dL 130  133  105   BUN 8 - 27 mg/dL $Remove'17  12  13   'NlKwhjR$ Creatinine 0.76 - 1.27 mg/dL 1.04  0.93  1.04   Sodium 134 - 144 mmol/L 140  136  135   Potassium 3.5 - 5.2 mmol/L 4.3  4.0  4.5   Chloride 96 - 106 mmol/L 97  97  93   CO2 20 - 29 mmol/L $RemoveB'24  23  25   'PrsNSQOm$ Calcium 8.6 - 10.2 mg/dL 9.9  9.4  10.1   Total Protein 6.0 - 8.5 g/dL 8.8  8.1  7.9   Total Bilirubin 0.0 - 1.2 mg/dL 0.6  0.9  0.7   Alkaline Phos 44 - 121 IU/L 55  56  42   AST 0 - 40 IU/L 58  78  31   ALT 0 - 44 IU/L 42  53  35       RADIOGRAPHIC STUDIES: I have personally reviewed the radiological images as listed  and agreed with the findings in the report. No results found.     ASSESSMENT & PLAN:   Macrocytosis Macrocytosis, likely secondary to chronic alcohol Increased total protein Check CBC, B12, folate.  Multiple myeloma panel.  And light chain ratio, 24-hour urine protein electrophoresis.  Elevated total protein Likely reactive.  Check myeloma work-up. We discussed that if all blood reports are normal.  Then he will not need further follow-up.  Orders Placed This Encounter  Procedures   Vitamin B12    Standing Status:   Future    Number of Occurrences:   1    Standing Expiration Date:   04/14/2023   Folate    Standing Status:   Future    Number of Occurrences:   1    Standing Expiration Date:   04/14/2023   Kappa/lambda light chains    Standing Status:   Future    Number of Occurrences:   1    Standing Expiration Date:   04/14/2023   Multiple Myeloma Panel (SPEP&IFE w/QIG)    Standing Status:   Future    Number of Occurrences:   1    Standing Expiration Date:   04/14/2023   CBC with Differential/Platelet    Standing Status:   Future    Number of Occurrences:   1  Standing Expiration Date:   04/14/2023   Technologist smear review    Standing Status:   Future    Number of Occurrences:   1    Standing Expiration Date:   04/14/2023    Order Specific Question:   Clinical information:    Answer:   macrocytosis   Lactate dehydrogenase    Standing Status:   Future    Number of Occurrences:   1    Standing Expiration Date:   04/14/2023    All questions were answered. The patient knows to call the clinic with any problems, questions or concerns.  [name], PA-C  Thank you for this kind referral and the opportunity to participate in the care of this patient. A copy of today's note is routed to referring provider   Earlie Server, MD, PhD [facility] Hematology Oncology 04/13/2022

## 2022-04-13 NOTE — Assessment & Plan Note (Signed)
Likely reactive.  Check myeloma work-up.

## 2022-04-13 NOTE — Progress Notes (Signed)
Patient here for initial oncology appointment, expresses no complaints, family history of multiple myeloma wanting to rule out

## 2022-04-13 NOTE — Assessment & Plan Note (Addendum)
Macrocytosis, likely secondary to chronic alcohol Increased total protein Check CBC, B12, folate.  Multiple myeloma panel.  And light chain ratio, 24-hour urine protein electrophoresis.

## 2022-04-14 LAB — KAPPA/LAMBDA LIGHT CHAINS
Kappa free light chain: 33.6 mg/L — ABNORMAL HIGH (ref 3.3–19.4)
Kappa, lambda light chain ratio: 1.2 (ref 0.26–1.65)
Lambda free light chains: 28.1 mg/L — ABNORMAL HIGH (ref 5.7–26.3)

## 2022-04-17 LAB — MULTIPLE MYELOMA PANEL, SERUM
Albumin SerPl Elph-Mcnc: 4.2 g/dL (ref 2.9–4.4)
Albumin/Glob SerPl: 1.1 (ref 0.7–1.7)
Alpha 1: 0.2 g/dL (ref 0.0–0.4)
Alpha2 Glob SerPl Elph-Mcnc: 0.7 g/dL (ref 0.4–1.0)
B-Globulin SerPl Elph-Mcnc: 1.5 g/dL — ABNORMAL HIGH (ref 0.7–1.3)
Gamma Glob SerPl Elph-Mcnc: 1.6 g/dL (ref 0.4–1.8)
Globulin, Total: 4.1 g/dL — ABNORMAL HIGH (ref 2.2–3.9)
IgA: 612 mg/dL — ABNORMAL HIGH (ref 61–437)
IgG (Immunoglobin G), Serum: 1571 mg/dL (ref 603–1613)
IgM (Immunoglobulin M), Srm: 169 mg/dL (ref 20–172)
Total Protein ELP: 8.3 g/dL (ref 6.0–8.5)

## 2022-04-20 ENCOUNTER — Telehealth: Payer: Self-pay

## 2022-04-20 DIAGNOSIS — D7589 Other specified diseases of blood and blood-forming organs: Secondary | ICD-10-CM

## 2022-04-20 NOTE — Telephone Encounter (Signed)
Patient was not available, but spoke to pt's wife, Christopher Cervantes, and informed her of results and recommendations. She verbalized understanding.   please schedule Lab/MD in 6 months and inform pt/ pt's wife about appts.

## 2022-04-20 NOTE — Telephone Encounter (Signed)
-----  Message from Earlie Server, MD sent at 04/19/2022  2:22 PM EDT ----- Please let patient know that lab work results are overall good.  I do not see any significant evidence of multiple myeloma. slight anemia which is likely secondary to previous alcohol use and given that he has stopped drinking alcohol, I expect that to improve in the future. I recommend him to do a follow-up visit with me in 6 months , please order CBC, check smear, B12, folate, reticulocyte panel, CMP, same-day MD visit.  Thank you

## 2022-04-21 ENCOUNTER — Ambulatory Visit: Payer: 59 | Admitting: Family Medicine

## 2022-06-17 ENCOUNTER — Telehealth: Payer: Self-pay | Admitting: Cardiovascular Disease

## 2022-06-17 MED ORDER — LOSARTAN POTASSIUM 100 MG PO TABS: 100.0000 mg | ORAL_TABLET | Freq: Every day | ORAL | 0 refills | Status: DC

## 2022-06-17 NOTE — Telephone Encounter (Signed)
Pt c/o medication issue:  1. Name of Medication: losartan (COZAAR) 100 MG tablet  2. How are you currently taking this medication (dosage and times per day)? Take 1 tablet (100 mg total) by mouth daily.  3. Are you having a reaction (difficulty breathing--STAT)? No   4. What is your medication issue? Patient needs a new 3 refill, 90 day prescription sent to

## 2022-06-30 DIAGNOSIS — L300 Nummular dermatitis: Secondary | ICD-10-CM | POA: Diagnosis not present

## 2022-06-30 DIAGNOSIS — L918 Other hypertrophic disorders of the skin: Secondary | ICD-10-CM | POA: Diagnosis not present

## 2022-07-06 NOTE — Progress Notes (Unsigned)
Cardiology Office Note  Date:  07/07/2022   ID:  Saeed, Toren 1956/05/30, MRN [id number]  PCP:  [name], PA-C   Chief Complaint  Patient presents with   12 month follow up     "Doing well." Medications reviewed by the patient verbally.      HPI:  Mr. Lang is a 66 year-old gentleman with past medical history of  hypertension,  hyperlipidemia,  irritable bowel syndrome  prostate issues,  palpitations,  Alcohol abuse who presents for evaluation of his hypertension and hyperlipidemia  LOV 05/19/21 Stress at home, daughter with lupus Goes to gym frequently, early Am Does cardio predominantly Denies chest pain or shortness of breath on exertion  BP at home: Well controlled, this morning 122/82 Weight stable  Nonsmoker  Lab work reviewed Total chol 269, LDL 122 In the past has been reluctant to take statin Takes red yeast rice  Prior history of drinking 4-5 beers a day  EKG personally reviewed by myself on todays visit Shows normal sinus rhythm with rate 77 bpm with no significant ST or T wave changes   Other past medical history  no significant family history of coronary artery disease.  PMH:   has a past medical history of Dental bridge present, GERD (gastroesophageal reflux disease), HLD (hyperlipidemia), HTN (hypertension), and IBS (irritable bowel syndrome).  PSH:    Past Surgical History:  Procedure Laterality Date   COLONOSCOPY WITH PROPOFOL N/A 09/17/2017   Procedure: COLONOSCOPY WITH PROPOFOL;  Surgeon: Lucilla Lame, MD;  Location: [facility];  Service: Endoscopy;  Laterality: N/A;   POLYPECTOMY  09/17/2017   Procedure: POLYPECTOMY INTESTINAL;  Surgeon: Lucilla Lame, MD;  Location: [facility];  Service: Endoscopy;;   TONSILLECTOMY     VASECTOMY     WISDOM TOOTH EXTRACTION      Current Outpatient Medications on File Prior to Visit  Medication Sig Dispense Refill   amLODipine (NORVASC) 10 MG tablet Take 1 tablet (10 mg  total) by mouth daily. 90 tablet 0   Cholecalciferol (VITAMIN D3 PO) Take by mouth.     Cyanocobalamin (VITAMIN B-12 PO) Take by mouth.     esomeprazole (NEXIUM) 10 MG packet Take 10 mg by mouth daily before breakfast.     losartan (COZAAR) 100 MG tablet Take 1 tablet (100 mg total) by mouth daily. 90 tablet 0   NAPROXEN SODIUM PO Take by mouth.     Omega-3 Fatty Acids (FISH OIL PO) Take by mouth.     OVER THE COUNTER MEDICATION Take by mouth.     Probiotic Product (SOLUBLE FIBER/PROBIOTICS PO) Take by mouth daily.     Red Yeast Rice 600 MG CAPS Take 2 capsules by mouth every other day. Reported on 11/19/2015     Turmeric (QC TUMERIC COMPLEX PO) Take by mouth.     busPIRone (BUSPAR) 7.5 MG tablet Take 1 tablet (7.5 mg total) by mouth 2 (two) times daily. (Patient not taking: Reported on 07/07/2022) 60 tablet 0   Coenzyme Q10 (COQ10 PO) Take by mouth. (Patient not taking: Reported on 04/13/2022)     finasteride (PROSCAR) 5 MG tablet Take 5 mg by mouth daily. (Patient not taking: Reported on 04/13/2022)     Misc Natural Products (CANDICIDAL) CAPS Take by mouth. (Patient not taking: Reported on 07/07/2022)     [DISCONTINUED] lisinopril (PRINIVIL,ZESTRIL) 40 MG tablet TAKE 1 TABLET(40 MG) BY MOUTH DAILY 30 tablet 0   No current facility-administered medications on file prior to visit.  Allergies:   Lactose, Lactose intolerance (gi), Shellfish allergy, and Wheat bran   Social History:  The patient  reports that he quit smoking about 40 years ago. His smoking use included cigarettes. He has never used smokeless tobacco. He reports current alcohol use of about 2.0 standard drinks of alcohol per week. He reports that he does not use drugs.   Family History:   family history includes Cancer in his father; Hyperlipidemia in his brother, mother, and sister; Hypertension in his brother, mother, and sister.    Review of Systems: Review of Systems  Constitutional: Negative.   Respiratory: Negative.     Cardiovascular: Negative.   Gastrointestinal: Negative.   Musculoskeletal: Negative.   Neurological: Negative.   Psychiatric/Behavioral: Negative.    All other systems reviewed and are negative.  PHYSICAL EXAM: VS:  BP (!) 140/60 (BP Location: Left Arm, Patient Position: Sitting, Cuff Size: Normal)   Pulse 77   Ht '5\' 11"'$  (1.803 m)   Wt 205 lb (93 kg)   SpO2 97%   BMI 28.59 kg/m  , BMI Body mass index is 28.59 kg/m. Constitutional:  oriented to person, place, and time. No distress.  HENT:  Head: Grossly normal Eyes:  no discharge. No scleral icterus.  Neck: No JVD, no carotid bruits  Cardiovascular: Regular rate and rhythm, no murmurs appreciated Pulmonary/Chest: Clear to auscultation bilaterally, no wheezes or rails Abdominal: Soft.  no distension.  no tenderness.  Musculoskeletal: Normal range of motion Neurological:  normal muscle tone. Coordination normal. No atrophy Skin: Skin warm and dry Psychiatric: normal affect, pleasant  Recent Labs: 03/11/2022: ALT 42; BUN 17; Creatinine, Ser 1.04; Potassium 4.3; Sodium 140 04/13/2022: Hemoglobin 12.7; Platelets 239    Lipid Panel Lab Results  Component Value Date   CHOL 269 (H) 03/11/2022   HDL 128 03/11/2022   LDLCALC 122 (H) 03/11/2022   TRIG 116 03/11/2022      Wt Readings from Last 3 Encounters:  07/07/22 205 lb (93 kg)  04/13/22 203 lb (92.1 kg)  03/11/22 209 lb 12.8 oz (95.2 kg)    ASSESSMENT AND PLAN:  Benign essential HTN - Plan: EKG 12-Lead Blood pressure is well controlled on today's visit. No changes made to the medications.  Mixed hyperlipidemia - Plan: EKG 12-Lead We have previously ordered CT calcium scoring, he did not have this completed Long discussion today concerning lipids.  Friend of his died suddenly, he is willing to try medication as red yeast rice not working and numbers remain high Recommend he add Crestor 5 mg daily, 2 weeks later would add Zetia 10 mg daily For side effects could  consider Repatha/Praluent  Cardiovascular screening Previously discussed CT coronary calcium scoring, he did not have this completed  ETOH Cessation recommended   Total encounter time more than 30 minutes  Greater than 50% was spent in counseling and coordination of care with the patient    Orders Placed This Encounter  Procedures   EKG 12-Lead      Signed, Esmond Plants, M.D., Ph.D. 07/07/2022  [place], [place] [phone]

## 2022-07-07 ENCOUNTER — Ambulatory Visit: Payer: 59 | Attending: Cardiovascular Disease | Admitting: Cardiovascular Disease

## 2022-07-07 ENCOUNTER — Encounter: Payer: Self-pay | Admitting: Cardiovascular Disease

## 2022-07-07 VITALS — BP 140/60 | HR 77 | Ht 71.0 in | Wt 205.0 lb

## 2022-07-07 DIAGNOSIS — I10 Essential (primary) hypertension: Secondary | ICD-10-CM | POA: Diagnosis not present

## 2022-07-07 DIAGNOSIS — E782 Mixed hyperlipidemia: Secondary | ICD-10-CM | POA: Diagnosis not present

## 2022-07-07 DIAGNOSIS — R69 Illness, unspecified: Secondary | ICD-10-CM | POA: Diagnosis not present

## 2022-07-07 DIAGNOSIS — F1010 Alcohol abuse, uncomplicated: Secondary | ICD-10-CM

## 2022-07-07 MED ORDER — AMLODIPINE BESYLATE 10 MG PO TABS: 10.0000 mg | ORAL_TABLET | Freq: Every day | ORAL | 0 refills | Status: DC

## 2022-07-07 MED ORDER — ROSUVASTATIN CALCIUM 5 MG PO TABS: 5.0000 mg | ORAL_TABLET | Freq: Every day | ORAL | 3 refills | Status: DC

## 2022-07-07 MED ORDER — EZETIMIBE 10 MG PO TABS: 10.0000 mg | ORAL_TABLET | Freq: Every day | ORAL | 3 refills | Status: DC

## 2022-07-07 NOTE — Patient Instructions (Addendum)
Medication Instructions:  Start Crestor 5 mg daily for 2 weeks Then Start Zetia 10 mg daily  Stop red yeast rice  If you need a refill on your cardiac medications before your next appointment, please call your pharmacy.   Lab work: Your physician recommends that you return for lab work in: 3 months [facility] the medical mall.  *Please fast for cholesterol check*   Testing/Procedures: No new testing needed  Follow-Up: At [facility], you and your health needs are our priority.  As part of our continuing mission to provide you with exceptional heart care, we have created designated Provider Care Teams.  These Care Teams include your primary Cardiologist (physician) and Advanced Practice Providers (APPs -  Physician Assistants and Nurse Practitioners) who all work together to provide you with the care you need, when you need it.  You will need a follow up appointment in 12 months  Providers on your designated Care Team:   Murray Hodgkins, NP Christell Faith, PA-C Cadence Kathlen Mody, [place]  COVID-19 Vaccine Information can be found at: [email or web address].uk For questions related to vaccine distribution or appointments, please email [email or web address] or call [phone].

## 2022-09-12 ENCOUNTER — Other Ambulatory Visit: Payer: Self-pay | Admitting: Cardiovascular Disease

## 2022-10-08 ENCOUNTER — Other Ambulatory Visit: Payer: Self-pay | Admitting: Cardiovascular Disease

## 2022-10-22 ENCOUNTER — Inpatient Hospital Stay: Payer: 59 | Admitting: Oncology

## 2022-10-22 ENCOUNTER — Other Ambulatory Visit: Payer: 59

## 2022-10-22 ENCOUNTER — Inpatient Hospital Stay: Payer: 59

## 2022-10-22 ENCOUNTER — Ambulatory Visit: Payer: 59 | Admitting: Oncology

## 2023-04-08 ENCOUNTER — Telehealth: Payer: Self-pay

## 2023-04-08 NOTE — Telephone Encounter (Signed)
LVM for patient to call back [phone], or to call PCP office to schedule follow up apt. AS, CMA  

## 2023-04-19 ENCOUNTER — Other Ambulatory Visit: Payer: Self-pay | Admitting: Cardiovascular Disease

## 2023-05-13 DIAGNOSIS — L2089 Other atopic dermatitis: Secondary | ICD-10-CM | POA: Diagnosis not present

## 2023-05-16 ENCOUNTER — Other Ambulatory Visit: Payer: Self-pay | Admitting: Cardiovascular Disease

## 2023-07-01 ENCOUNTER — Encounter: Payer: Self-pay | Admitting: Family Medicine

## 2023-07-01 ENCOUNTER — Ambulatory Visit
Admission: RE | Admit: 2023-07-01 | Discharge: 2023-07-01 | Disposition: A | Discharge status: Home / Self Care | Payer: Medicare HMO | Attending: Family Medicine | Admitting: Family Medicine

## 2023-07-01 ENCOUNTER — Ambulatory Visit
Admission: RE | Admit: 2023-07-01 | Discharge: 2023-07-01 | Disposition: A | Discharge status: Home / Self Care | Payer: Medicare HMO | Source: Ambulatory Visit | Attending: Family Medicine | Admitting: Family Medicine

## 2023-07-01 ENCOUNTER — Ambulatory Visit (INDEPENDENT_AMBULATORY_CARE_PROVIDER_SITE_OTHER): Payer: Medicare HMO | Admitting: Family Medicine

## 2023-07-01 VITALS — BP 147/73 | HR 120 | Ht 72.0 in | Wt 208.8 lb

## 2023-07-01 DIAGNOSIS — J301 Allergic rhinitis due to pollen: Secondary | ICD-10-CM

## 2023-07-01 DIAGNOSIS — R059 Cough, unspecified: Secondary | ICD-10-CM | POA: Diagnosis not present

## 2023-07-01 DIAGNOSIS — R052 Subacute cough: Secondary | ICD-10-CM

## 2023-07-01 DIAGNOSIS — Z0000 Encounter for general adult medical examination without abnormal findings: Secondary | ICD-10-CM | POA: Insufficient documentation

## 2023-07-01 NOTE — Progress Notes (Signed)
Established patient visit   Patient: Christopher Cervantes   DOB: 1956/07/18   67 y.o. Male  MRN: [id number] Visit Date: 07/01/2023  Today's healthcare provider: Sherlyn Hay, DO   Chief Complaint  Patient presents with   Cough    Reports it was just in the morning but has progressed to going on throughout the day and is now lingering. Patient reports it started a month ago but in the last week and a half it has been more persistent. Patient reports taking otc DM. Reports to help him better at night. Reports mainly dry and if productive it is clear. Covid test taken yesterday and it was negative.   Subjective    Cough Associated symptoms include postnasal drip (chronic), rhinorrhea (chronic), shortness of breath (minor, can still do 3 miles on elliptical) and wheezing (a small amount). Pertinent negatives include no chest pain, chills, ear pain, fever, headaches or sore throat.   Started one month ago Taking robitussin DM  Has ENT appointment next week; esp in relation to deviated septum Has a lot of dry mouth; purchased Biotene recently     Medications: Outpatient Medications Prior to Visit  Medication Sig   amLODipine (NORVASC) 10 MG tablet Take 1 tablet (10 mg total) by mouth daily. PLEASE CALL [phone] TO SCHEDULE YEARLY FOLLOW UP AND TO RECEIVE FURTHER REFILLS. THANK YOU.   busPIRone (BUSPAR) 7.5 MG tablet Take 1 tablet (7.5 mg total) by mouth 2 (two) times daily.   Cholecalciferol (VITAMIN D3 PO) Take by mouth.   Coenzyme Q10 (COQ10 PO) Take by mouth.   Cyanocobalamin (VITAMIN B-12 PO) Take by mouth.   esomeprazole (NEXIUM) 10 MG packet Take 10 mg by mouth daily before breakfast.   ezetimibe (ZETIA) 10 MG tablet Take 1 tablet (10 mg total) by mouth daily.   finasteride (PROSCAR) 5 MG tablet Take 5 mg by mouth daily.   losartan (COZAAR) 100 MG tablet TAKE 1 TABLET BY MOUTH EVERY DAY   Misc Natural Products (CANDICIDAL) CAPS Take by mouth.   NAPROXEN SODIUM PO Take  by mouth.   Omega-3 Fatty Acids (FISH OIL PO) Take by mouth.   OVER THE COUNTER MEDICATION Take by mouth.   Probiotic Product (SOLUBLE FIBER/PROBIOTICS PO) Take by mouth daily.   rosuvastatin (CRESTOR) 5 MG tablet Take 1 tablet (5 mg total) by mouth daily.   Turmeric (QC TUMERIC COMPLEX PO) Take by mouth.   No facility-administered medications prior to visit.    Review of Systems  Constitutional:  Negative for activity change, chills, fatigue and fever.  HENT:  Positive for congestion (chronic), postnasal drip (chronic), rhinorrhea (chronic) and sinus pressure (chronic). Negative for ear pain, sore throat and trouble swallowing.   Respiratory:  Positive for cough, shortness of breath (minor, can still do 3 miles on elliptical) and wheezing (a small amount).   Cardiovascular:  Negative for chest pain, palpitations and leg swelling.  Neurological:  Negative for weakness and headaches.        Objective    BP (!) 147/73 (BP Location: Left Arm, Patient Position: Sitting, Cuff Size: Normal)   Pulse (!) 120   Ht 6' (1.829 m)   Wt 208 lb 12.8 oz (94.7 kg)   SpO2 100%   BMI 28.32 kg/m     Physical Exam Constitutional:      Appearance: Normal appearance.  HENT:     Head: Normocephalic and atraumatic.  Eyes:     General: No scleral icterus.  Extraocular Movements: Extraocular movements intact.     Conjunctiva/sclera: Conjunctivae normal.  Cardiovascular:     Rate and Rhythm: Normal rate and regular rhythm.     Pulses: Normal pulses.     Heart sounds: Normal heart sounds.  Pulmonary:     Effort: Pulmonary effort is normal. No respiratory distress.     Breath sounds: Examination of the right-lower field reveals rhonchi. Rhonchi (mild) present.  Abdominal:     General: Bowel sounds are normal. There is no distension.     Palpations: Abdomen is soft. There is no mass.     Tenderness: There is no abdominal tenderness. There is no guarding.  Musculoskeletal:     Right lower leg:  No edema.     Left lower leg: No edema.  Skin:    General: Skin is warm and dry.  Neurological:     Mental Status: He is alert and oriented to person, place, and time. Mental status is at baseline.  Psychiatric:        Mood and Affect: Mood normal.        Behavior: Behavior normal.      No results found for any visits on 07/01/23.  Assessment & Plan    Subacute cough Assessment & Plan: I suspect the patient's cough this secondary to his uncontrolled seasonal allergies.  I recommended he start OTC Zyrtec daily, with plan to stepdown to Claritin if this is particularly drying. Did notice a very minute amount of possible crackles at the base of his right lung, so I will evaluate with a chest x-ray today.  However, I suspect antibiotic treatment will not be necessary.  Patient declined CPE and labs today.  I did recommend he follow up with cardiology and oncology.  Orders: -     DG Chest 2 View; Future  Seasonal allergic rhinitis due to pollen    Return in about 4 weeks (around 07/29/2023) for CPE.      I discussed the assessment and treatment plan with the patient  The patient was provided an opportunity to ask questions and all were answered. The patient agreed with the plan and demonstrated an understanding of the instructions.   The patient was advised to call back or seek an in-person evaluation if the symptoms worsen or if the condition fails to improve as anticipated.    Sherlyn Hay, DO  [facility] [phone] (phone) [phone] (fax)  [facility]

## 2023-07-01 NOTE — Assessment & Plan Note (Signed)
I suspect the patient's cough this secondary to his uncontrolled seasonal allergies.  I recommended he start OTC Zyrtec daily, with plan to stepdown to Claritin if this is particularly drying. Did notice a very minute amount of possible crackles at the base of his right lung, so I will evaluate with a chest x-ray today.  However, I suspect antibiotic treatment will not be necessary.  Patient declined CPE and labs today.  I did recommend he follow up with cardiology and oncology.

## 2023-07-01 NOTE — Patient Instructions (Addendum)
Follow up with cardiology   Follow up with oncology - originally recommended follow-up by February 2024. recommend him to do a follow-up visit with me in 6 months , please order CBC, check smear, B12, folate, reticulocyte panel, CMP, same-day MD visit.

## 2023-07-01 NOTE — Assessment & Plan Note (Deleted)
Recheck CMP and lipid panel today. Continue rosuvastatin 5 mg nightly Continue ezetimibe 10 mg daily May consider increasing dose if the patient is tolerating his medications well and his lipids/ASCVD risk score remain high.

## 2023-07-01 NOTE — Assessment & Plan Note (Deleted)
Will repeat labs indicated by oncology and recommend patient schedule a follow-up thank

## 2023-07-01 NOTE — Assessment & Plan Note (Deleted)
Physical exam overall unremarkable except as noted above. Routine lab work ordered as noted.

## 2023-07-07 ENCOUNTER — Ambulatory Visit: Payer: Self-pay

## 2023-07-07 ENCOUNTER — Telehealth: Payer: Self-pay

## 2023-07-07 DIAGNOSIS — R052 Subacute cough: Secondary | ICD-10-CM

## 2023-07-07 DIAGNOSIS — J301 Allergic rhinitis due to pollen: Secondary | ICD-10-CM | POA: Diagnosis not present

## 2023-07-07 DIAGNOSIS — R062 Wheezing: Secondary | ICD-10-CM | POA: Diagnosis not present

## 2023-07-07 NOTE — Telephone Encounter (Signed)
Chief Complaint: SOB Symptoms: mild to moderate SOB and wheezing, cough, nasal congestion  Frequency: comes and goes  Pertinent Negatives: Patient denies fever, chest pain, nausea, vomiting Disposition: [] ED /[] Urgent Care (no appt availability in office) / [] Appointment(In office/virtual)/ []  [place] [facility]/ [] Home Care/ [] Refused Recommended Disposition /[] [place] [facility]/ [x]  Follow-up with PCP Additional Notes: Patient states he was seen 07/01/23 with a cough. Patient called to request a referral to pulmonology since the results from his chest x-ray will not be available for about 2 weeks. Patient reported having mild to moderate SOB and wheezing that comes and goes. He also reported a continuous cough and nasal congestion. Patient is requesting some medication be since to [facility] [place] for the cough and congestion because the over the counter medication is not improving his symptoms. Care advice given and advised patient that I would forward the request to PCP for additional recommendations.   Reason for Disposition  [1] MODERATE longstanding difficulty breathing (e.g., speaks in phrases, SOB even at rest, pulse 100-120) AND [2] SAME as normal  Answer Assessment - Initial Assessment Questions 1. RESPIRATORY STATUS: "Describe your breathing?" (e.g., wheezing, shortness of breath, unable to speak, severe coughing)      Mild to Moderate SOB, Wheezing  2. ONSET: "When did this breathing problem begin?"      About 1 month ago  3. PATTERN "Does the difficult breathing come and go, or has it been constant since it started?"      Comes and goes  4. SEVERITY: "How bad is your breathing?" (e.g., mild, moderate, severe)    - MILD: No SOB at rest, mild SOB with walking, speaks normally in sentences, can lie down, no retractions, pulse < 100.    - MODERATE: SOB at rest, SOB with minimal exertion and prefers to sit, cannot lie down flat, speaks in phrases, mild retractions, audible  wheezing, pulse 100-120.    - SEVERE: Very SOB at rest, speaks in single words, struggling to breathe, sitting hunched forward, retractions, pulse > 120      Mild to Moderate 5. RECURRENT SYMPTOM: "Have you had difficulty breathing before?" If Yes, ask: "When was the last time?" and "What happened that time?"      Yes, I take over the counter medicine  6. CARDIAC HISTORY: "Do you have any history of heart disease?" (e.g., heart attack, angina, bypass surgery, angioplasty)      No  7. LUNG HISTORY: "Do you have any history of lung disease?"  (e.g., pulmonary embolus, asthma, emphysema)     No 8. CAUSE: "What do you think is causing the breathing problem?"      I've had a cough/ cold this whole time  9. OTHER SYMPTOMS: "Do you have any other symptoms? (e.g., dizziness, runny nose, cough, chest pain, fever)     Cough, nasal congestion,  Protocols used: Breathing Difficulty-A-AH

## 2023-07-07 NOTE — Telephone Encounter (Signed)
Copied from CRM [id number]. Topic: Referral - Request for Referral >> Jul 07, 2023  9:41 AM [place] wrote: Has patient seen PCP for this complaint? No. *If NO, is insurance requiring patient see PCP for this issue before PCP can refer them? Referral for which specialty: Pulmonology Preferred provider/office: [facility] / Address: [address] [place], [place], [place] [address] Reason for referral:Pt is requesting referral to [facility]. Stated was seen in office 10/24 experiencing SOB and wheezing. Pt was transferred to NT.   See TE from NT 07/07/23.  Please advise.

## 2023-07-07 NOTE — Telephone Encounter (Signed)
Patient was seen by Dr. Payton Mccallum and is requesting patient to be seen at Pulmonary please. Routing to Dr. Payton Mccallum since patient was seen by her on 07/01/2023-

## 2023-07-09 ENCOUNTER — Ambulatory Visit: Payer: Self-pay | Admitting: *Deleted

## 2023-07-09 NOTE — Telephone Encounter (Signed)
  Chief Complaint: requesting  to change BP medications due to coughing Symptoms: has been cough clear productive cough greater than a month. Stopped taking Bp meds since reviewing on line BP meds can cause coughing. Patient reports since stopping BP meds coughing has stopped x 1 day. Did not report what BP is now after NT requested patient to take BP  Frequency: na  Pertinent Negatives: Patient denies chest pain no difficulty breathing . Denies headache no blurred vision no dizziness Disposition: [] ED /[] Urgent Care (no appt availability in office) / [] Appointment(In office/virtual)/ []  [place] [facility]/ [] Home Care/ [] Refused Recommended Disposition /[] [place] [facility]/ [x]  Follow-up with PCP Additional Notes:   Offered patient appt and patient requesting to see "lung dr" Tuesday and then he will f/u with PCP. Patient would like PCP to change medications for BP to see if that is the cause of coughing . Please advise        Reason for Disposition  [1] Continuous (nonstop) coughing interferes with work or school AND [2] no improvement using cough treatment per Care Advice  Answer Assessment - Initial Assessment Questions 1. ONSET: "When did the cough begin?"      Greater than couple of months  2. SEVERITY: "How bad is the cough today?"       Productive clear  3. SPUTUM: "Describe the color of your sputum" (none, dry cough; clear, white, yellow, green)     clear 4. HEMOPTYSIS: "Are you coughing up any blood?" If so ask: "How much?" (flecks, streaks, tablespoons, etc.)     na 5. DIFFICULTY BREATHING: "Are you having difficulty breathing?" If Yes, ask: "How bad is it?" (e.g., mild, moderate, severe)    - MILD: No SOB at rest, mild SOB with walking, speaks normally in sentences, can lie down, no retractions, pulse < 100.    - MODERATE: SOB at rest, SOB with minimal exertion and prefers to sit, cannot lie down flat, speaks in phrases, mild retractions, audible wheezing, pulse  100-120.    - SEVERE: Very SOB at rest, speaks in single words, struggling to breathe, sitting hunched forward, retractions, pulse > 120      Na  6. FEVER: "Do you have a fever?" If Yes, ask: "What is your temperature, how was it measured, and when did it start?"     na 7. CARDIAC HISTORY: "Do you have any history of heart disease?" (e.g., heart attack, congestive heart failure)      na 8. LUNG HISTORY: "Do you have any history of lung disease?"  (e.g., pulmonary embolus, asthma, emphysema)     na 9. PE RISK FACTORS: "Do you have a history of blood clots?" (or: recent major surgery, recent prolonged travel, bedridden)     na 10. OTHER SYMPTOMS: "Do you have any other symptoms?" (e.g., runny nose, wheezing, chest pain)       Cough stopped after not taking BP meds since yesterday morning 11. PREGNANCY: "Is there any chance you are pregnant?" "When was your last menstrual period?"       na 12. TRAVEL: "Have you traveled out of the country in the last month?" (e.g., travel history, exposures)       na  Protocols used: Cough - Acute Productive-A-AH

## 2023-07-13 DIAGNOSIS — R052 Subacute cough: Secondary | ICD-10-CM | POA: Diagnosis not present

## 2023-07-13 DIAGNOSIS — R0602 Shortness of breath: Secondary | ICD-10-CM | POA: Diagnosis not present

## 2023-07-13 DIAGNOSIS — R600 Localized edema: Secondary | ICD-10-CM | POA: Diagnosis not present

## 2023-07-13 DIAGNOSIS — J302 Other seasonal allergic rhinitis: Secondary | ICD-10-CM | POA: Diagnosis not present

## 2023-07-14 ENCOUNTER — Telehealth: Payer: Self-pay | Admitting: Physician Assistant

## 2023-07-14 NOTE — Telephone Encounter (Signed)
Spoke with pt regarding his cough and his current BP medications.He states that he stopped taking both amlodipine and losartan and his cough stopped. His BP is below 140/90. Advised to continue taking amlodipine but temporarily d/c taking losartan and measure his BP at home daily. Pt agreed to send his BP levels through my chart in a week to reassess the BP. Pt declined to be seen by provider in the clinic because he saw a provider a week ago and believes that his PCP could change BP medication without seeing him. Otherwise, he will find other means to deal with his current medical conditions. Of note, pt was seen by Dr. Payton Mccallum on 07/01/23 for subacute cough and allergic rhinitis and was advised to return to the clinic in 4 weeks for CPE Results of chest XR from 07/01/23 are still pending. Pt was seen by PCP on 03/12/23

## 2023-07-17 ENCOUNTER — Other Ambulatory Visit: Payer: Self-pay | Admitting: Cardiovascular Disease

## 2023-07-20 ENCOUNTER — Telehealth: Payer: Self-pay | Admitting: Physician Assistant

## 2023-07-20 ENCOUNTER — Ambulatory Visit: Payer: Self-pay | Admitting: *Deleted

## 2023-07-20 NOTE — Telephone Encounter (Signed)
Referral Request - Did the patient discuss referral with their provider in the last year? yes    Type of order/referral and detailed reason for visit: cough/wheezing  Preference of office, provider, location: Dr Milas Kocher  If referral order, have you been seen by this specialty before? yes Yes at the hospital  Can we respond through MyChart? yes

## 2023-07-20 NOTE — Telephone Encounter (Signed)
  Chief Complaint: Patient states he has been trying to figure out cough/wheezing symptom for some time- pulmonologist-states 70% of lung capacity, but x-ray shows lungs are clear, patient is requesting referral back to cariology. Patient declines appointment with PCP now- he states he does not think there is anything she can do for him at this point. Patient has scheduled his annual physical as requested in last office note. Symptoms: cough- dry- clear sputum at times, wheezing- does have inhaler Frequency: chronic Pertinent Negatives: Patient denies SOB Disposition: [] ED /[] Urgent Care (no appt availability in office) / [] Appointment(In office/virtual)/ []  [facility]/ [] Home Care/ [x] Refused Recommended Disposition /[] [facility]/ []  Follow-up with PCP Additional Notes: Patient is calling to request referral to cardiologist. Patient has scheduled annual physical (first available -December) and is requesting cough medication to calm his chronic cough. He does not feel he needs to be seen immediately for this problem as he has been referred out to pulmonology and PCP should have notes.    Reason for Disposition  [1] MILD longstanding difficulty breathing AND [2]  SAME as normal  Answer Assessment - Initial Assessment Questions 1. RESPIRATORY STATUS: "Describe your breathing?" (e.g., wheezing, shortness of breath, unable to speak, severe coughing)      Wheezing, cough 2. ONSET: "When did this breathing problem begin?"      Ongoing- 2 month 3. PATTERN "Does the difficult breathing come and go, or has it been constant since it started?"      Comes an goes 4. SEVERITY: "How bad is your breathing?" (e.g., mild, moderate, severe)    - MILD: No SOB at rest, mild SOB with walking, speaks normally in sentences, can lie down, no retractions, pulse < 100.    - MODERATE: SOB at rest, SOB with minimal exertion and prefers to sit, cannot lie down flat, speaks in phrases, mild  retractions, audible wheezing, pulse 100-120.    - SEVERE: Very SOB at rest, speaks in single words, struggling to breathe, sitting hunched forward, retractions, pulse > 120      Pulmonary states 70%, treatment inhaler- helps a little, cough- bothersome 5. RECURRENT SYMPTOM: "Have you had difficulty breathing before?" If Yes, ask: "When was the last time?" and "What happened that time?"      Cough- mainly dry- clear sputum Is requesting referral to cardiologist  Protocols used: Breathing Difficulty-A-AH

## 2023-07-20 NOTE — Telephone Encounter (Signed)
Pt. Calling again for medication for his cough. Using [facility] in [place]. Request has already been sent to the provider.

## 2023-07-21 NOTE — Telephone Encounter (Signed)
Contacted Christopher Cervantes via phone per his request.  Sent him results of his previous CXR. Christopher Cervantes saw pulmonology recently on 07/13/23 and will schedule an appointment with cardiology / per my previous correspondence with him for an evaluation of his SOB. There are no notes from his visit with pulmonology.  On 07/07/23, Christopher Cervantes received an albuterol inhaler for his cough, wheezing and SOB and he reports he is doing better. However, he insists that he needs to have a cough medication prescribed by "[place] guys"  just in case if his cough will get worse. Per review of his past medications, he was prescribed tussionex before. Christopher Cervantes was advised to contact pulmonology who saw him most recently for "simple cough medication" Rx. He still insists that he expect "[place] guys" to send him cough medication. MHX is significant for alcohol abuse.

## 2023-07-22 ENCOUNTER — Encounter: Payer: Self-pay | Admitting: Physician Assistant

## 2023-07-23 NOTE — Telephone Encounter (Signed)
Patient has been notified in regards to the referral. He is stating that he still has that cough and has seen Pulmonology and ENT and no one has helped him. He is asking for cough medication and it doesn't have to be codeine.

## 2023-08-17 ENCOUNTER — Other Ambulatory Visit: Payer: Self-pay | Admitting: Cardiovascular Disease

## 2023-08-20 NOTE — Telephone Encounter (Signed)
Pt being seen on 2/14

## 2023-09-02 ENCOUNTER — Encounter: Payer: Medicare HMO | Admitting: Physician Assistant

## 2023-09-02 NOTE — Progress Notes (Deleted)
 Complete physical exam  Patient: Christopher Cervantes   DOB: 1956/04/28   67 y.o. Male  MRN: [id number] Visit Date: 09/13/2023  Today's healthcare provider: Debera Lat, PA-C   No chief complaint on file.  Subjective    Christopher Cervantes is a 67 y.o. male who presents today for a complete physical exam.  He reports consuming a {diet types:17450} diet. {Exercise:19826} He generally feels {well/fairly well/poorly:18703}. He reports sleeping {well/fairly well/poorly:18703}. He {does/does not:200015} have additional problems to discuss today.  HPI  *** Discussed the use of AI scribe software for clinical note transcription with the patient, who gave verbal consent to proceed.  History of Present Illness            Last depression screening scores    03/11/2022   10:20 AM 02/08/2020    8:28 AM 01/03/2018   11:28 AM  PHQ 2/9 Scores  PHQ - 2 Score 1 0 0  PHQ- 9 Score 2 0    Last fall risk screening    03/11/2022   10:20 AM  Fall Risk   Falls in the past year? 0  Number falls in past yr: 0  Injury with Fall? 0   Last Audit-C alcohol use screening    03/11/2022   10:20 AM  Alcohol Use Disorder Test (AUDIT)  1. How often do you have a drink containing alcohol? 3  2. How many drinks containing alcohol do you have on a typical day when you are drinking? 0  3. How often do you have six or more drinks on one occasion? 3  AUDIT-C Score 6  4. How often during the last year have you found that you were not able to stop drinking once you had started? 1  5. How often during the last year have you failed to do what was normally expected from you because of drinking? 0  6. How often during the last year have you needed a first drink in the morning to get yourself going after a heavy drinking session? 0  7. How often during the last year have you had a feeling of guilt of remorse after drinking? 2  8. How often during the last year have you been unable to remember what happened the night  before because you had been drinking? 0  9. Have you or someone else been injured as a result of your drinking? 0  10. Has a relative or friend or a doctor or another health worker been concerned about your drinking or suggested you cut down? 4  Alcohol Use Disorder Identification Test Final Score (AUDIT) 13   A score of 3 or more in women, and 4 or more in men indicates increased risk for alcohol abuse, EXCEPT if all of the points are from question 1   Past Medical History:  Diagnosis Date  . Dental bridge present    top right  . GERD (gastroesophageal reflux disease)   . HLD (hyperlipidemia)   . HTN (hypertension)   . IBS (irritable bowel syndrome)    Past Surgical History:  Procedure Laterality Date  . COLONOSCOPY WITH PROPOFOL N/A 09/17/2017   Procedure: COLONOSCOPY WITH PROPOFOL;  Surgeon: Midge Minium, MD;  Location: [facility];  Service: Endoscopy;  Laterality: N/A;  . POLYPECTOMY  09/17/2017   Procedure: POLYPECTOMY INTESTINAL;  Surgeon: Midge Minium, MD;  Location: [facility];  Service: Endoscopy;;  . TONSILLECTOMY    . VASECTOMY    . WISDOM TOOTH EXTRACTION  Social History   Socioeconomic History  . Marital status: Married    Spouse name: Not on file  . Number of children: Not on file  . Years of education: Not on file  . Highest education level: Not on file  Occupational History  . Not on file  Tobacco Use  . Smoking status: Former    Current packs/day: 0.00    Types: Cigarettes    Quit date: 12/21/1981    Years since quitting: 41.7  . Smokeless tobacco: Never  Vaping Use  . Vaping status: Never Used  Substance and Sexual Activity  . Alcohol use: Yes    Alcohol/week: 2.0 standard drinks of alcohol    Types: 2 Glasses of wine per week    Comment:    . Drug use: No  . Sexual activity: Not on file  Other Topics Concern  . Not on file  Social History Narrative  . Not on file   Social Drivers of Health   Financial Resource Strain: Low  Risk  (07/12/2023)   Received from [facility]   Overall Financial Resource Strain (CARDIA)   . Difficulty of Paying Living Expenses: Not hard at all  Food Insecurity: No Food Insecurity (07/12/2023)   Received from [facility]   Hunger Vital Sign   . Worried About Programme researcher, broadcasting/film/video in the Last Year: Never true   . Ran Out of Food in the Last Year: Never true  Transportation Needs: No Transportation Needs (07/12/2023)   Received from [facility]   [facility] - Transportation   . In the past 12 months, has lack of transportation kept you from medical appointments or from getting medications?: No   . Lack of Transportation (Non-Medical): No  Physical Activity: Not on file  Stress: Not on file  Social Connections: Not on file  Intimate Partner Violence: Not on file   Family Status  Relation Name Status  . Mother  Alive  . Father  Deceased at age 44  . Sister  Alive  . Brother  Alive  No partnership data on file   Family History  Problem Relation Age of Onset  . Hypertension Mother   . Hyperlipidemia Mother   . Cancer Father   . Hyperlipidemia Sister   . Hypertension Sister   . Hyperlipidemia Brother   . Hypertension Brother    Allergies  Allergen Reactions  . Lactose     "leaky gut"  . Lactose Intolerance (Gi)     "leaky gut"  . Shellfish Allergy     Stomach issues Stomach issues  . Wheat     "leaky gut"    Patient Care Team: Debera Lat, PA-C as PCP - General (Physician Assistant) Antonieta Iba, MD as PCP - Cardiology (Cardiology) Antonieta Iba, MD as Consulting Physician (Cardiology) Anola Gurney, [place] as Referring Physician (Family Medicine) Lemar Livings Merrily Pew, MD (General Surgery)   Medications: Outpatient Medications Prior to Visit  Medication Sig  . amLODipine (NORVASC) 10 MG tablet TAKE 1 TABLET BY MOUTH DAILY.CALL [phone] TO SCHEDULE YEARLY FOLLOW UP FOR FURTHER REFILLS.  Marland Kitchen busPIRone  (BUSPAR) 7.5 MG tablet Take 1 tablet (7.5 mg total) by mouth 2 (two) times daily.  . Cholecalciferol (VITAMIN D3 PO) Take by mouth.  . Coenzyme Q10 (COQ10 PO) Take by mouth.  . Cyanocobalamin (VITAMIN B-12 PO) Take by mouth.  . esomeprazole (NEXIUM) 10 MG packet Take 10 mg by mouth daily before breakfast.  . ezetimibe (  ZETIA) 10 MG tablet Take 1 tablet (10 mg total) by mouth daily.  . finasteride (PROSCAR) 5 MG tablet Take 5 mg by mouth daily.  Marland Kitchen losartan (COZAAR) 100 MG tablet TAKE 1 TABLET BY MOUTH EVERY DAY  . Misc Natural Products (CANDICIDAL) CAPS Take by mouth.  Marland Kitchen NAPROXEN SODIUM PO Take by mouth.  . Omega-3 Fatty Acids (FISH OIL PO) Take by mouth.  Marland Kitchen OVER THE COUNTER MEDICATION Take by mouth.  . Probiotic Product (SOLUBLE FIBER/PROBIOTICS PO) Take by mouth daily.  . rosuvastatin (CRESTOR) 5 MG tablet Take 1 tablet (5 mg total) by mouth daily.  . Turmeric (QC TUMERIC COMPLEX PO) Take by mouth.   No facility-administered medications prior to visit.    Review of Systems  All other systems reviewed and are negative. Except see HPI  {Insert previous labs (optional):23779} {See past labs  Heme  Chem  Endocrine  Serology  Results Review (optional):1}  Objective    There were no vitals taken for this visit. {Insert last BP/Wt (optional):23777}{See vitals history (optional):1}    Physical Exam Vitals and nursing note reviewed.  Constitutional:      General: He is not in acute distress.    Appearance: Normal appearance. He is obese. He is not ill-appearing, toxic-appearing or diaphoretic.  HENT:     Head: Normocephalic and atraumatic.     Right Ear: Tympanic membrane, ear canal and external ear normal.     Left Ear: Tympanic membrane, ear canal and external ear normal.     Nose: Nose normal. No congestion or rhinorrhea.     Mouth/Throat:     Mouth: Mucous membranes are moist.     Pharynx: Oropharynx is clear. No oropharyngeal exudate or posterior oropharyngeal erythema.   Eyes:     General: No scleral icterus.       Right eye: No discharge.        Left eye: No discharge.     Extraocular Movements: Extraocular movements intact.     Conjunctiva/sclera: Conjunctivae normal.     Pupils: Pupils are equal, round, and reactive to light.  Neck:     Vascular: No carotid bruit.  Cardiovascular:     Rate and Rhythm: Normal rate and regular rhythm.     Pulses: Normal pulses.     Heart sounds: Normal heart sounds. No murmur heard.    No friction rub. No gallop.  Pulmonary:     Effort: Pulmonary effort is normal. No respiratory distress.     Breath sounds: Normal breath sounds. No stridor. No wheezing, rhonchi or rales.  Chest:     Chest wall: No tenderness.  Abdominal:     General: Abdomen is flat. Bowel sounds are normal. There is no distension.     Palpations: Abdomen is soft. There is no mass.     Tenderness: There is no abdominal tenderness. There is no right CVA tenderness, left CVA tenderness, guarding or rebound.     Hernia: No hernia is present.  Musculoskeletal:        General: No swelling, tenderness, deformity or signs of injury. Normal range of motion.     Cervical back: Normal range of motion. No rigidity or tenderness.     Right lower leg: No edema.     Left lower leg: No edema.  Lymphadenopathy:     Cervical: No cervical adenopathy.  Skin:    General: Skin is warm.     Capillary Refill: Capillary refill takes less than 2 seconds.  Coloration: Skin is not jaundiced or pale.     Findings: No bruising, erythema, lesion or rash.  Neurological:     Mental Status: He is alert and oriented to person, place, and time. Mental status is at baseline.     Cranial Nerves: No cranial nerve deficit.     Sensory: No sensory deficit.     Motor: No weakness.     Coordination: Coordination normal.     Gait: Gait normal.     Deep Tendon Reflexes: Reflexes normal.  Psychiatric:        Behavior: Behavior normal.        Thought Content: Thought content  normal.        Judgment: Judgment normal.     No results found for any visits on 09/13/23.  Assessment & Plan    Routine Health Maintenance and Physical Exam  Exercise Activities and Dietary recommendations  Goals   None     Immunization History  Administered Date(s) Administered  . Td 02/08/2020  . Tdap 03/08/2009    Health Maintenance  Topic Date Due  . Medicare Annual Wellness Visit  Never done  . HIV Screening  Never done  . Zoster (Shingles) Vaccine (1 of 2) Never done  . Colon Cancer Screening  09/17/2022  . Pneumonia Vaccine (1 of 1 - PCV) Never done  . COVID-19 Vaccine (1 - 2024-25 season) Never done  . Flu Shot  12/06/2023*  . DTaP/Tdap/Td vaccine (3 - Td or Tdap) 02/07/2030  . Hepatitis C Screening  Completed  . HPV Vaccine  Aged Out  *Topic was postponed. The date shown is not the original due date.    Discussed health benefits of physical activity, and encouraged him to engage in regular exercise appropriate for his age and condition.  Assessment and Plan              ***  No follow-ups on file.    The patient was advised to call back or seek an in-person evaluation if the symptoms worsen or if the condition fails to improve as anticipated.  I discussed the assessment and treatment plan with the patient. The patient was provided an opportunity to ask questions and all were answered. The patient agreed with the plan and demonstrated an understanding of the instructions.  I, Debera Lat, PA-C have reviewed all documentation for this visit. The documentation on 09/13/2023  for the exam, diagnosis, procedures, and orders are all accurate and complete.  Debera Lat, [facility], MMS [facility] [phone] (phone) [phone] (fax)  [facility]

## 2023-09-13 ENCOUNTER — Encounter: Payer: Medicare HMO | Admitting: Physician Assistant

## 2023-09-13 DIAGNOSIS — Z0000 Encounter for general adult medical examination without abnormal findings: Secondary | ICD-10-CM

## 2023-09-30 ENCOUNTER — Encounter: Payer: Medicare HMO | Admitting: Physician Assistant

## 2023-10-15 ENCOUNTER — Other Ambulatory Visit: Payer: Self-pay | Admitting: Physician Assistant

## 2023-10-15 ENCOUNTER — Ambulatory Visit (INDEPENDENT_AMBULATORY_CARE_PROVIDER_SITE_OTHER): Payer: Medicare HMO | Admitting: Physician Assistant

## 2023-10-15 ENCOUNTER — Encounter: Payer: Self-pay | Admitting: Physician Assistant

## 2023-10-15 VITALS — BP 152/76 | HR 118 | Ht 72.0 in | Wt 214.6 lb

## 2023-10-15 DIAGNOSIS — R5383 Other fatigue: Secondary | ICD-10-CM

## 2023-10-15 DIAGNOSIS — R053 Chronic cough: Secondary | ICD-10-CM

## 2023-10-15 DIAGNOSIS — Z0001 Encounter for general adult medical examination with abnormal findings: Secondary | ICD-10-CM | POA: Diagnosis not present

## 2023-10-15 DIAGNOSIS — Z1211 Encounter for screening for malignant neoplasm of colon: Secondary | ICD-10-CM

## 2023-10-15 DIAGNOSIS — E663 Overweight: Secondary | ICD-10-CM | POA: Diagnosis not present

## 2023-10-15 DIAGNOSIS — R000 Tachycardia, unspecified: Secondary | ICD-10-CM

## 2023-10-15 DIAGNOSIS — Z0000 Encounter for general adult medical examination without abnormal findings: Secondary | ICD-10-CM

## 2023-10-15 MED ORDER — METOPROLOL SUCCINATE ER 25 MG PO TB24: 25.0000 mg | ORAL_TABLET | Freq: Every day | ORAL | 3 refills | Status: DC

## 2023-10-15 MED ORDER — LANSOPRAZOLE 15 MG PO CPDR: 15.0000 mg | DELAYED_RELEASE_CAPSULE | Freq: Two times a day (BID) | ORAL | 1 refills | Status: DC

## 2023-10-15 NOTE — Progress Notes (Signed)
 Medicare Initial Preventative Physical Exam    Patient: Christopher Cervantes, Male    DOB: 11-20-1955, 68 y.o.   MRN: [id number] Visit Date: 10/15/2023  Today's Provider: Korban Shearer, PA-C   Chief Complaint  Patient presents with   Annual Exam   Subjective    Medicare Initial Preventative Physical Exam Christopher Cervantes is a 68 y.o. male who presents today for his Initial Preventative Physical Exam.   HPI The patient, with a history of hypertension and a family history of atrial fibrillation, presents with a chronic cough and wheezing that has been ongoing for about three months. The cough is worse in the mornings and can be severe enough to induce a vomiting sensation. The patient also reports occasional swelling in the feet, which resolves with elevation. He has a history of sinus issues and acid reflux, for which he takes over-the-counter Nexium as needed. The patient's heart rate is noted to be high during the visit, which he attributes to anxiety. He also reports taking red yeast and garlic for cholesterol management. Social History   Socioeconomic History   Marital status: Married    Spouse name: Not on file   Number of children: Not on file   Years of education: Not on file   Highest education level: Not on file  Occupational History   Not on file  Tobacco Use   Smoking status: Former    Current packs/day: 0.00    Types: Cigarettes    Quit date: 12/21/1981    Years since quitting: 41.8   Smokeless tobacco: Never  Vaping Use   Vaping status: Never Used  Substance and Sexual Activity   Alcohol use: Yes    Alcohol/week: 2.0 standard drinks of alcohol    Types: 2 Glasses of wine per week    Comment:     Drug use: No   Sexual activity: Not on file  Other Topics Concern   Not on file  Social History Narrative   Not on file   Social Drivers of Health   Financial Resource Strain: Low Risk  (07/12/2023)   Received from [facility]   Overall Financial  Resource Strain (CARDIA)    Difficulty of Paying Living Expenses: Not hard at all  Food Insecurity: No Food Insecurity (07/12/2023)   Received from [facility]   Hunger Vital Sign    Worried About Running Out of Food in the Last Year: Never true    Ran Out of Food in the Last Year: Never true  Transportation Needs: No Transportation Needs (07/12/2023)   Received from [facility] - Transportation    In the past 12 months, has lack of transportation kept you from medical appointments or from getting medications?: No    Lack of Transportation (Non-Medical): No  Physical Activity: Not on file  Stress: Not on file  Social Connections: Not on file  Intimate Partner Violence: Not on file    Past Medical History:  Diagnosis Date   Dental bridge present    top right   GERD (gastroesophageal reflux disease)    HLD (hyperlipidemia)    HTN (hypertension)    IBS (irritable bowel syndrome)      Patient Active Problem List   Diagnosis Date Noted   Annual physical exam 07/01/2023   Subacute cough 07/01/2023   Macrocytosis 04/13/2022   Elevated total protein 04/13/2022   Pain and swelling of ankle, left 08/22/2021   Pain in  ankle joint increased by plantar flexion 08/22/2021   Ankle injury, left, sequela 08/22/2021   Allergic rhinitis 12/22/2017   History of colonic polyps    Benign neoplasm of cecum    Benign neoplasm of transverse colon    Atopic dermatitis 01/01/2017   Chronic anxiety 11/12/2015   Benign prostatic hyperplasia with urinary obstruction 09/05/2012   CA in situ prostate 09/05/2012   Chronic prostatitis 09/05/2012   ED (erectile dysfunction) of organic origin 09/05/2012   Hyperlipidemia 01/06/2010   Benign essential HTN 01/06/2010    Past Surgical History:  Procedure Laterality Date   COLONOSCOPY WITH PROPOFOL  N/A 09/17/2017   Procedure: COLONOSCOPY WITH PROPOFOL ;  Surgeon: Jinny Carmine, MD;  Location: [facility];   Service: Endoscopy;  Laterality: N/A;   POLYPECTOMY  09/17/2017   Procedure: POLYPECTOMY INTESTINAL;  Surgeon: Jinny Carmine, MD;  Location: [facility];  Service: Endoscopy;;   TONSILLECTOMY     VASECTOMY     WISDOM TOOTH EXTRACTION      His family history includes Cancer in his father; Hyperlipidemia in his brother, mother, and sister; Hypertension in his brother, mother, and sister.   Current Outpatient Medications:    amLODipine  (NORVASC ) 10 MG tablet, TAKE 1 TABLET BY MOUTH DAILY.CALL [phone] TO SCHEDULE YEARLY FOLLOW UP FOR FURTHER REFILLS., Disp: 90 tablet, Rfl: 0   busPIRone  (BUSPAR ) 7.5 MG tablet, Take 1 tablet (7.5 mg total) by mouth 2 (two) times daily., Disp: 60 tablet, Rfl: 0   Cholecalciferol (VITAMIN D3 PO), Take by mouth., Disp: , Rfl:    Coenzyme Q10 (COQ10 PO), Take by mouth., Disp: , Rfl:    Cyanocobalamin  (VITAMIN B-12 PO), Take by mouth., Disp: , Rfl:    esomeprazole (NEXIUM) 10 MG packet, Take 10 mg by mouth daily before breakfast., Disp: , Rfl:    ezetimibe  (ZETIA ) 10 MG tablet, Take 1 tablet (10 mg total) by mouth daily., Disp: 90 tablet, Rfl: 3   finasteride (PROSCAR) 5 MG tablet, Take 5 mg by mouth daily., Disp: , Rfl:    lansoprazole  (PREVACID ) 15 MG capsule, Take 1 capsule (15 mg total) by mouth 2 (two) times daily before a meal., Disp: 60 capsule, Rfl: 1   losartan  (COZAAR ) 100 MG tablet, TAKE 1 TABLET BY MOUTH EVERY DAY, Disp: 60 tablet, Rfl: 0   metoprolol  succinate (TOPROL -XL) 25 MG 24 hr tablet, Take 1 tablet (25 mg total) by mouth daily., Disp: 90 tablet, Rfl: 3   Misc Natural Products (CANDICIDAL) CAPS, Take by mouth., Disp: , Rfl:    NAPROXEN SODIUM PO, Take by mouth., Disp: , Rfl:    Omega-3 Fatty Acids (FISH OIL PO), Take by mouth., Disp: , Rfl:    OVER THE COUNTER MEDICATION, Take by mouth., Disp: , Rfl:    Probiotic Product (SOLUBLE FIBER/PROBIOTICS PO), Take by mouth daily., Disp: , Rfl:    rosuvastatin  (CRESTOR ) 5 MG tablet, Take 1  tablet (5 mg total) by mouth daily., Disp: 90 tablet, Rfl: 3   Turmeric (QC TUMERIC COMPLEX PO), Take by mouth., Disp: , Rfl:    Patient Care Team: Ariela Mochizuki, PA-C as PCP - General (Physician Assistant) Gollan, Timothy J, MD as PCP - Cardiology (Cardiology) Perla Evalene PARAS, MD as Consulting Physician (Cardiology) Chauvin, Robert, [place] as Referring Physician (Family Medicine) Dessa Reyes ORN, MD (General Surgery)  Review of Systems  All other systems reviewed and are negative.      Objective    Vitals: BP (!) 152/76   Pulse (!) 118  Ht 6' (1.829 m)   Wt 214 lb 9.6 oz (97.3 kg)   SpO2 99%   BMI 29.10 kg/m  No results found. Physical Exam Vitals reviewed.  Constitutional:      General: He is not in acute distress.    Appearance: Normal appearance. He is well-developed. He is not ill-appearing, toxic-appearing or diaphoretic.  HENT:     Head: Normocephalic and atraumatic.     Right Ear: Tympanic membrane, ear canal and external ear normal.     Left Ear: Tympanic membrane, ear canal and external ear normal.     Nose: Congestion and rhinorrhea present.     Mouth/Throat:     Mouth: Mucous membranes are moist.     Pharynx: Oropharynx is clear. No oropharyngeal exudate.  Eyes:     General: No scleral icterus.       Right eye: No discharge.        Left eye: No discharge.     Conjunctiva/sclera: Conjunctivae normal.     Pupils: Pupils are equal, round, and reactive to light.  Neck:     Thyroid: No thyromegaly.     Vascular: No carotid bruit.  Cardiovascular:     Rate and Rhythm: Normal rate and regular rhythm.     Pulses: Normal pulses.     Heart sounds: Normal heart sounds. No murmur heard.    No friction rub. No gallop.  Pulmonary:     Effort: Pulmonary effort is normal. No respiratory distress.     Breath sounds: Normal breath sounds. No wheezing or rales.  Abdominal:     General: Abdomen is flat. Bowel sounds are normal. There is no distension.      Palpations: Abdomen is soft. There is no mass.     Tenderness: There is no abdominal tenderness. There is no right CVA tenderness, left CVA tenderness, guarding or rebound.     Hernia: No hernia is present.  Musculoskeletal:        General: No swelling, tenderness, deformity or signs of injury. Normal range of motion.     Cervical back: Normal range of motion and neck supple. No rigidity or tenderness.     Right lower leg: No edema.     Left lower leg: No edema.  Lymphadenopathy:     Cervical: No cervical adenopathy.  Skin:    General: Skin is warm and dry.     Coloration: Skin is not jaundiced or pale.     Findings: No bruising, erythema, lesion or rash.  Neurological:     Mental Status: He is alert and oriented to person, place, and time. Mental status is at baseline.     Gait: Gait normal.  Psychiatric:        Mood and Affect: Mood normal.        Behavior: Behavior normal.        Thought Content: Thought content normal.        Judgment: Judgment normal.     Activities of Daily Living    10/16/2023   11:22 PM  In your present state of health, do you have any difficulty performing the following activities:  Hearing? 0  Vision? 0  Difficulty concentrating or making decisions? 0  Walking or climbing stairs? 0  Dressing or bathing? 0  Doing errands, shopping? 0    Fall Risk Assessment    03/11/2022   10:20 AM 01/03/2018   11:28 AM 01/04/2017    9:03 AM  Fall Risk   Falls in the  past year? 0 No No  Number falls in past yr: 0    Injury with Fall? 0       Depression Screen    10/15/2023   10:43 AM 03/11/2022   10:20 AM 02/08/2020    8:28 AM 01/03/2018   11:28 AM  PHQ 2/9 Scores  PHQ - 2 Score 0 1 0 0  PHQ- 9 Score 0 2 0     No results found for any visits on 10/15/23.  Assessment & Plan      Initial Preventative Physical Exam  Reviewed patient's Family Medical History Reviewed and updated list of patient's medical providers Assessment of cognitive impairment was  done Assessed patient's functional ability Established a written schedule for health screening services Health Risk Assessent Completed and Reviewed  Exercise Activities and Dietary recommendations  Goals   None     Immunization History  Administered Date(s) Administered   Td 02/08/2020   Tdap 03/08/2009    Health Maintenance  Topic Date Due   HIV Screening  Never done   Zoster Vaccines- Shingrix (1 of 2) Never done   Colonoscopy  09/17/2022   Pneumonia Vaccine 49+ Years old (1 of 1 - PCV) Never done   COVID-19 Vaccine (1 - 2024-25 season) Never done   INFLUENZA VACCINE  12/06/2023 (Originally 04/08/2023)   Medicare Annual Wellness (AWV)  10/14/2024   DTaP/Tdap/Td (3 - Td or Tdap) 02/07/2030   Hepatitis C Screening  Completed   HPV VACCINES  Aged Out     Discussed health benefits of physical activity, and encouraged him to engage in regular exercise appropriate for his age and condition.   Annual physical exam UTD on dental/eye Things to do to keep yourself healthy  - Exercise at least 30-45 minutes a day, 3-4 days a week.  - Eat a low-fat diet with lots of fruits and vegetables, up to 7-9 servings per day.  - Seatbelts can save your life. Wear them always.  - Smoke detectors on every level of your home, check batteries every year.  - Eye Doctor - have an eye exam every 1-2 years  - Safe sex - if you may be exposed to STDs, use a condom.  - Alcohol -  If you drink, do it moderately, less than 2 drinks per day.  - Health Care Power of Attorney. Choose someone to speak for you if you are not able.  - Depression is common in our stressful world.If you're feeling down or losing interest in things you normally enjoy, please come in for a visit.  - Violence - If anyone is threatening or hurting you, please call immediately.  Tachycardia Increased heart rate, particularly in medical settings (white coat syndrome). Family history of atrial fibrillation. No current cardiac  symptoms. Cardiology appointment scheduled. -Start Metoprolol  25mg  to manage heart rate. -Continue monitoring symptoms and follow up with cardiology/scheduled for next week.  Hypertension Chronic and unstable Managed with Amlodipine  and Losartan . -Continue current medications. Follow up with cardiology  Hyperlipidemia chronic Patient stopped taking Crestor  and is using red yeast and garlic for cholesterol management. -Continue current regimen and monitor lipid levels.  Chronic Cough and Wheezing Ongoing for three months, worse in the morning. No smoking history. Possible environmental exposure. No symptoms of ear congestion or pain. -Refer to pulmonology for further evaluation. -Consider ENT referral for potential sinusitis. -Start daily antihistamine (Allegra, Claritin, or Zyrtec), nasal saline rinse/spray/gel, and Flonase. -Consider changing Losartan  if cough persists.  Gastroesophageal Reflux Disease (GERD) Over-the-counter  Nexium as needed. -Continue current regimen. -Consider lifestyle modifications (diet, exercise, and sleep position).  Lower Extremity Edema Intermittent swelling in feet, improves with elevation. No current leg swelling observed. -Continue monitoring symptoms.  General Health Maintenance -Complete stool sample for colorectal cancer screening. -Schedule follow-up in three months for chronic disease management. -Schedule annual wellness visit in one year.  Return in about 3 months (around 01/12/2024) for chronic disease f/u in 3 mo, AW in year.    I, Florabel Faulks, PA-C have reviewed all documentation for this visit. The documentation on  10/15/23 for the exam, diagnosis, procedures, and orders are all accurate and complete.   Jolynn Spencer, [facility], MMS [facility] [phone] (phone) [phone] (fax)

## 2023-10-18 NOTE — Telephone Encounter (Signed)
 Requested medication (s) are due for refill today: routing for review  Requested medication (s) are on the active medication list: yes  Last refill:  10/15/23  Future visit scheduled: yes  Notes to clinic:  Pharmacy comment: Alternative Requested:THE PRESCRIBED MEDICATION IS NOT COVERED BY INSURANCE. PLEASE CONSIDER CHANGING TO ONE OF THE SUGGESTED COVERED ALTERNATIVES.      Requested Prescriptions  Pending Prescriptions Disp Refills   pantoprazole  (PROTONIX ) 20 MG tablet [Pharmacy Med Name: PANTOPRAZOLE  SOD DR 20 MG TAB]  0     Gastroenterology: Proton Pump Inhibitors Passed - 10/18/2023  9:14 AM      Passed - Valid encounter within last 12 months    Recent Outpatient Visits           3 months ago Subacute cough   [facility] [place], Asencion Blacksmith, DO   1 year ago Benign essential HTN   [facility] [place], [place], [place]   2 years ago Pain and swelling of ankle, left   [facility] Iona Manis T, FNP   2 years ago COVID-19   [facility] Zorita Hiss., MD   3 years ago Chronic sinusitis, unspecified location   [facility] Zorita Hiss., MD       Future Appointments             In 4 days Gollan, Timothy J, MD [facility] [place]   In 2 months Ostwalt, Janna, PA-C [facility]   In 1 year Deering, Janna, PA-C [place] [facility], [facility]

## 2023-10-19 ENCOUNTER — Other Ambulatory Visit: Payer: Self-pay | Admitting: Physician Assistant

## 2023-10-19 DIAGNOSIS — K219 Gastro-esophageal reflux disease without esophagitis: Secondary | ICD-10-CM

## 2023-10-19 MED ORDER — PANTOPRAZOLE SODIUM 40 MG PO TBEC: 40.0000 mg | DELAYED_RELEASE_TABLET | Freq: Every day | ORAL | 3 refills | Status: AC

## 2023-10-19 NOTE — Telephone Encounter (Signed)
Sent a different med covered by [employer]

## 2023-10-22 ENCOUNTER — Encounter: Payer: Self-pay | Admitting: Cardiovascular Disease

## 2023-10-22 ENCOUNTER — Ambulatory Visit: Payer: Medicare HMO | Attending: Cardiovascular Disease | Admitting: Cardiovascular Disease

## 2023-10-22 VITALS — BP 120/70 | HR 82 | Ht 72.0 in | Wt 216.0 lb

## 2023-10-22 DIAGNOSIS — R000 Tachycardia, unspecified: Secondary | ICD-10-CM

## 2023-10-22 DIAGNOSIS — F1010 Alcohol abuse, uncomplicated: Secondary | ICD-10-CM

## 2023-10-22 DIAGNOSIS — R7989 Other specified abnormal findings of blood chemistry: Secondary | ICD-10-CM | POA: Diagnosis not present

## 2023-10-22 DIAGNOSIS — E782 Mixed hyperlipidemia: Secondary | ICD-10-CM | POA: Diagnosis not present

## 2023-10-22 DIAGNOSIS — I10 Essential (primary) hypertension: Secondary | ICD-10-CM

## 2023-10-22 LAB — CBC WITH DIFFERENTIAL/PLATELET
Basophils Absolute: 0 10*3/uL (ref 0.0–0.2)
Basos: 1 %
EOS (ABSOLUTE): 0.1 10*3/uL (ref 0.0–0.4)
Eos: 1 %
Hematocrit: 42.4 % (ref 37.5–51.0)
Hemoglobin: 14.6 g/dL (ref 13.0–17.7)
Immature Grans (Abs): 0.1 10*3/uL (ref 0.0–0.1)
Immature Granulocytes: 2 %
Lymphocytes Absolute: 1.6 10*3/uL (ref 0.7–3.1)
Lymphs: 26 %
MCH: 35.4 pg — ABNORMAL HIGH (ref 26.6–33.0)
MCHC: 34.4 g/dL (ref 31.5–35.7)
MCV: 103 fL — ABNORMAL HIGH (ref 79–97)
Monocytes Absolute: 0.9 10*3/uL (ref 0.1–0.9)
Monocytes: 14 %
Neutrophils Absolute: 3.7 10*3/uL (ref 1.4–7.0)
Neutrophils: 56 %
Platelets: 249 10*3/uL (ref 150–450)
RBC: 4.13 x10E6/uL — ABNORMAL LOW (ref 4.14–5.80)
RDW: 12.8 % (ref 11.6–15.4)
WBC: 6.4 10*3/uL (ref 3.4–10.8)

## 2023-10-22 LAB — COMPREHENSIVE METABOLIC PANEL
ALT: 38 [IU]/L (ref 0–44)
AST: 81 [IU]/L — ABNORMAL HIGH (ref 0–40)
Albumin: 4.5 g/dL (ref 3.9–4.9)
Alkaline Phosphatase: 67 [IU]/L (ref 44–121)
BUN/Creatinine Ratio: 10 (ref 10–24)
BUN: 11 mg/dL (ref 8–27)
Bilirubin Total: 0.8 mg/dL (ref 0.0–1.2)
CO2: 24 mmol/L (ref 20–29)
Calcium: 10.1 mg/dL (ref 8.6–10.2)
Chloride: 96 mmol/L (ref 96–106)
Creatinine, Ser: 1.06 mg/dL (ref 0.76–1.27)
Globulin, Total: 4.4 g/dL (ref 1.5–4.5)
Glucose: 154 mg/dL — ABNORMAL HIGH (ref 70–99)
Potassium: 4 mmol/L (ref 3.5–5.2)
Sodium: 139 mmol/L (ref 134–144)
Total Protein: 8.9 g/dL — ABNORMAL HIGH (ref 6.0–8.5)
eGFR: 78 mL/min/{1.73_m2} (ref >59)

## 2023-10-22 LAB — LIPID PANEL
Chol/HDL Ratio: 2.7 {ratio} (ref 0.0–5.0)
Cholesterol, Total: 279 mg/dL — ABNORMAL HIGH (ref 100–199)
HDL: 104 mg/dL (ref >39)
LDL Chol Calc (NIH): 157 mg/dL — ABNORMAL HIGH (ref 0–99)
Triglycerides: 110 mg/dL (ref 0–149)
VLDL Cholesterol Cal: 18 mg/dL (ref 5–40)

## 2023-10-22 LAB — TSH: TSH: 3.74 u[IU]/mL (ref 0.450–4.500)

## 2023-10-22 LAB — HEMOGLOBIN A1C
Est. average glucose Bld gHb Est-mCnc: 160 mg/dL
Hgb A1c MFr Bld: 7.2 % — ABNORMAL HIGH (ref 4.8–5.6)

## 2023-10-22 MED ORDER — METOPROLOL SUCCINATE ER 25 MG PO TB24
25.0000 mg | ORAL_TABLET | Freq: Every day | ORAL | 3 refills | Status: AC
Start: 2023-10-22 — End: ?

## 2023-10-22 MED ORDER — ROSUVASTATIN CALCIUM 5 MG PO TABS: 5.0000 mg | ORAL_TABLET | Freq: Every day | ORAL | 3 refills | Status: AC

## 2023-10-22 MED ORDER — LOSARTAN POTASSIUM 100 MG PO TABS: 100.0000 mg | ORAL_TABLET | Freq: Every day | ORAL | 3 refills | Status: DC

## 2023-10-22 MED ORDER — AMLODIPINE BESYLATE 10 MG PO TABS: 10.0000 mg | ORAL_TABLET | Freq: Every day | ORAL | 3 refills | Status: DC

## 2023-10-22 MED ORDER — FUROSEMIDE 20 MG PO TABS: 20.0000 mg | ORAL_TABLET | Freq: Every day | ORAL | 3 refills | Status: DC | PRN

## 2023-10-22 NOTE — Patient Instructions (Addendum)
Call if you would like a CT coronary calcium score, looks for blockages $99   Medication Instructions:  Please retry Rosuvastatin 5 mg daily for cholesterol  Take furosemide 20 mg daily as needed, when legs painful  If you need a refill on your cardiac medications before your next appointment, please call your pharmacy.   Lab work: No new labs needed  Testing/Procedures: No new testing needed  Follow-Up: At [facility], you and your health needs are our priority.  As part of our continuing mission to provide you with exceptional heart care, we have created designated Provider Care Teams.  These Care Teams include your primary Cardiologist (physician) and Advanced Practice Providers (APPs -  Physician Assistants and Nurse Practitioners) who all work together to provide you with the care you need, when you need it.  You will need a follow up appointment in 12 months  Providers on your designated Care Team:   Nicolasa Ducking, NP Eula Listen, PA-C [facility]ence Fransico [place], [place]  COVID-19 Vaccine Information can be found at: [email or web address] For questions related to vaccine distribution or appointments, please email [email or web address] or call [phone].

## 2023-10-22 NOTE — Progress Notes (Signed)
Cardiology Office Note  Date:  10/22/2023   ID:  Christopher Cervantes, Christopher Cervantes 1955-09-28, MRN [id number]  PCP:  Debera Lat, PA-C   Chief Complaint  Patient presents with   Follow-up    Patient with complaint of cough, wheeze and shortness of breath. Patient states that he recently started taking Metoprolol and his symptoms have improved.      HPI:  Christopher Cervantes is a 68 year-old gentleman with past medical history of  hypertension,  hyperlipidemia,  irritable bowel syndrome  prostate issues,  palpitations,  Alcohol abuse who presents for evaluation of his hypertension and hyperlipidemia  LOV October 2023  In general reports he has been doing well Feels his weight has been running high, weight up 11 pounds compared to prior clinic visit Continues to go to the gym but diet has been poor Goes to gym frequently, 5-6 days a week Does cardio predominantly  Concerned about elevated AST Continues to drink upwards of 5 or more beers per day  Nonsmoker  Lab work reviewed Total chol 279 LDL 157  In the past has been reluctant to take statin Takes red yeast rice  EKG personally reviewed by myself on todays visit EKG Interpretation Date/Time:  Friday October 22 2023 09:20:56 EST Ventricular Rate:  82 PR Interval:  166 QRS Duration:  100 QT Interval:  380 QTC Calculation: 443 R Axis:   -6  Text Interpretation: Normal sinus rhythm Normal ECG When compared with ECG of 15-Apr-2020 15:17, No significant change was found Confirmed by Julien Nordmann [phone]) on 10/22/2023 9:53:40 AM   Other past medical history  no significant family history of coronary artery disease.  PMH:   has a past medical history of Dental bridge present, GERD (gastroesophageal reflux disease), HLD (hyperlipidemia), HTN (hypertension), and IBS (irritable bowel syndrome).  PSH:    Past Surgical History:  Procedure Laterality Date   COLONOSCOPY WITH PROPOFOL N/A 09/17/2017   Procedure: COLONOSCOPY WITH PROPOFOL;   Surgeon: Midge Minium, MD;  [facility];  Service: Endoscopy;  Laterality: N/A;   POLYPECTOMY  09/17/2017   Procedure: POLYPECTOMY INTESTINAL;  Surgeon: Midge Minium, MD;  Location: [facility];  Service: Endoscopy;;   TONSILLECTOMY     VASECTOMY     WISDOM TOOTH EXTRACTION      Current Outpatient Medications on File Prior to Visit  Medication Sig Dispense Refill   amLODipine (NORVASC) 10 MG tablet TAKE 1 TABLET BY MOUTH DAILY.CALL [phone] TO SCHEDULE YEARLY FOLLOW UP FOR FURTHER REFILLS. 90 tablet 0   busPIRone (BUSPAR) 7.5 MG tablet Take 1 tablet (7.5 mg total) by mouth 2 (two) times daily. 60 tablet 0   Cholecalciferol (VITAMIN D3 PO) Take by mouth.     Coenzyme Q10 (COQ10 PO) Take by mouth.     Cyanocobalamin (VITAMIN B-12 PO) Take by mouth.     esomeprazole (NEXIUM) 10 MG packet Take 10 mg by mouth daily before breakfast.     ezetimibe (ZETIA) 10 MG tablet Take 1 tablet (10 mg total) by mouth daily. 90 tablet 3   fluticasone (FLONASE) 50 MCG/ACT nasal spray Place 2 sprays into both nostrils daily.     FOLIC ACID PO Take by mouth.     Garlic (GARLIQUE PO) Take by mouth.     losartan (COZAAR) 100 MG tablet TAKE 1 TABLET BY MOUTH EVERY DAY 60 tablet 0   metoprolol succinate (TOPROL-XL) 25 MG 24 hr tablet Take 1 tablet (25 mg total) by mouth daily. 90 tablet 3  Misc Natural Products (CANDICIDAL) CAPS Take by mouth.     NAPROXEN SODIUM PO Take by mouth.     Omega-3 Fatty Acids (FISH OIL PO) Take by mouth.     OVER THE COUNTER MEDICATION Take by mouth.     Probiotic Product (SOLUBLE FIBER/PROBIOTICS PO) Take by mouth daily.     Turmeric (QC TUMERIC COMPLEX PO) Take by mouth.     pantoprazole (PROTONIX) 40 MG tablet Take 1 tablet (40 mg total) by mouth daily. (Patient not taking: Reported on 10/22/2023) 30 tablet 3   [DISCONTINUED] lisinopril (PRINIVIL,ZESTRIL) 40 MG tablet TAKE 1 TABLET(40 MG) BY MOUTH DAILY 30 tablet 0   No current facility-administered  medications on file prior to visit.    Allergies:   Lactose, Lactose intolerance (gi), Shellfish allergy, and Wheat   Social History:  The patient  reports that he quit smoking about 41 years ago. His smoking use included cigarettes. He has never used smokeless tobacco. He reports current alcohol use of about 28.0 standard drinks of alcohol per week. He reports that he does not use drugs.   Family History:   family history includes Cancer in his father; Hyperlipidemia in his brother, mother, and sister; Hypertension in his brother, mother, and sister.    Review of Systems: Review of Systems  Constitutional: Negative.   Respiratory: Negative.    Cardiovascular: Negative.   Gastrointestinal: Negative.   Musculoskeletal: Negative.   Neurological: Negative.   Psychiatric/Behavioral: Negative.    All other systems reviewed and are negative.  PHYSICAL EXAM: VS:  BP 120/70 (BP Location: Left Arm, Patient Position: Sitting, Cuff Size: Normal)   Pulse 82   Ht 6' (1.829 m)   Wt 216 lb (98 kg)   SpO2 97%   BMI 29.29 kg/m  , BMI Body mass index is 29.29 kg/m. Constitutional:  oriented to person, place, and time. No distress.  HENT:  Head: Grossly normal Eyes:  no discharge. No scleral icterus.  Neck: No JVD, no carotid bruits  Cardiovascular: Regular rate and rhythm, no murmurs appreciated Pulmonary/Chest: Clear to auscultation bilaterally, no wheezes or rails Abdominal: Soft.  no distension.  no tenderness.  Musculoskeletal: Normal range of motion Neurological:  normal muscle tone. Coordination normal. No atrophy Skin: Skin warm and dry Psychiatric: normal affect, pleasant  Recent Labs: 10/21/2023: ALT 38; BUN 11; Creatinine, Ser 1.06; Hemoglobin 14.6; Platelets 249; Potassium 4.0; Sodium 139; TSH 3.740    Lipid Panel Lab Results  Component Value Date   CHOL 279 (H) 10/21/2023   HDL 104 10/21/2023   LDLCALC 157 (H) 10/21/2023   TRIG 110 10/21/2023      Wt Readings from  Last 3 Encounters:  10/22/23 216 lb (98 kg)  10/15/23 214 lb 9.6 oz (97.3 kg)  07/01/23 208 lb 12.8 oz (94.7 kg)    ASSESSMENT AND PLAN:  Benign essential HTN - Plan: EKG 12-Lead Blood pressure is well controlled on today's visit. No changes made to the medications.  Mixed hyperlipidemia - Plan: EKG 12-Lead Very elevated cholesterol, prefers not to be on statins We recommended CT calcium scoring, previously declined Discussed again today Ideally should be on medication for hyperlipidemia -We did suggest Zetia, bempedoic acid if nonstatin alternatives desired  ETOH Cessation recommended  Elevated LFTs Elevated AST, likely NASH But also with high beer intake daily for many years Recommended cessation of alcohol Also recommended weight loss given 11 pound weight gain over the past year and a half from poor diet Recommend he continue  his exercise program, follow a low carbohydrate diet   Orders Placed This Encounter  Procedures   EKG 12-Lead    Signed, Dossie Arbour, M.D., Ph.D. 10/22/2023  [facility] [place], [place] [phone]

## 2023-10-24 ENCOUNTER — Encounter: Payer: Self-pay | Admitting: Physician Assistant

## 2023-10-24 NOTE — Progress Notes (Signed)
metformin 500mg  once daily with breakfast, #90 refill 0

## 2023-11-03 ENCOUNTER — Other Ambulatory Visit: Payer: Self-pay

## 2023-11-03 ENCOUNTER — Emergency Department: Payer: Medicare HMO

## 2023-11-03 ENCOUNTER — Ambulatory Visit: Payer: Self-pay | Admitting: Physician Assistant

## 2023-11-03 ENCOUNTER — Emergency Department
Admission: EM | Admit: 2023-11-03 | Discharge: 2023-11-03 | Disposition: A | Discharge status: Home / Self Care | Payer: Medicare HMO | Attending: Emergency Medicine | Admitting: Emergency Medicine

## 2023-11-03 DIAGNOSIS — R7989 Other specified abnormal findings of blood chemistry: Secondary | ICD-10-CM | POA: Diagnosis not present

## 2023-11-03 DIAGNOSIS — R1909 Other intra-abdominal and pelvic swelling, mass and lump: Secondary | ICD-10-CM | POA: Diagnosis not present

## 2023-11-03 DIAGNOSIS — N503 Cyst of epididymis: Secondary | ICD-10-CM | POA: Diagnosis not present

## 2023-11-03 DIAGNOSIS — N433 Hydrocele, unspecified: Secondary | ICD-10-CM | POA: Diagnosis not present

## 2023-11-03 DIAGNOSIS — R319 Hematuria, unspecified: Secondary | ICD-10-CM | POA: Diagnosis not present

## 2023-11-03 DIAGNOSIS — N5089 Other specified disorders of the male genital organs: Secondary | ICD-10-CM | POA: Diagnosis not present

## 2023-11-03 DIAGNOSIS — N020 Recurrent and persistent hematuria with minor glomerular abnormality: Secondary | ICD-10-CM

## 2023-11-03 LAB — CBC
HCT: 39 % (ref 39.0–52.0)
Hemoglobin: 13.5 g/dL (ref 13.0–17.0)
MCH: 34.9 pg — ABNORMAL HIGH (ref 26.0–34.0)
MCHC: 34.6 g/dL (ref 30.0–36.0)
MCV: 100.8 fL — ABNORMAL HIGH (ref 80.0–100.0)
Platelets: 244 10*3/uL (ref 150–400)
RBC: 3.87 MIL/uL — ABNORMAL LOW (ref 4.22–5.81)
RDW: 13.4 % (ref 11.5–15.5)
WBC: 7.3 10*3/uL (ref 4.0–10.5)
nRBC: 0 % (ref 0.0–0.2)

## 2023-11-03 LAB — BASIC METABOLIC PANEL
Anion gap: 11 (ref 5–15)
BUN: 18 mg/dL (ref 8–23)
CO2: 26 mmol/L (ref 22–32)
Calcium: 9.6 mg/dL (ref 8.9–10.3)
Chloride: 99 mmol/L (ref 98–111)
Creatinine, Ser: 1.28 mg/dL — ABNORMAL HIGH (ref 0.61–1.24)
GFR, Estimated: >60 mL/min (ref >60)
Glucose, Bld: 134 mg/dL — ABNORMAL HIGH (ref 70–99)
Potassium: 3.5 mmol/L (ref 3.5–5.1)
Sodium: 136 mmol/L (ref 135–145)

## 2023-11-03 LAB — URINALYSIS, ROUTINE W REFLEX MICROSCOPIC
Bacteria, UA: NONE SEEN
Bilirubin Urine: NEGATIVE
Glucose, UA: NEGATIVE mg/dL
Ketones, ur: NEGATIVE mg/dL
Nitrite: NEGATIVE
Protein, ur: NEGATIVE mg/dL
Specific Gravity, Urine: 1.005 (ref 1.005–1.030)
Squamous Epithelial / HPF: 0 /[HPF] (ref 0–5)
pH: 6 (ref 5.0–8.0)

## 2023-11-03 NOTE — ED Triage Notes (Signed)
 First Nurse Note: Patient to ED from [facility] for scrotal swelling.

## 2023-11-03 NOTE — Discharge Instructions (Addendum)
 Been diagnosed with hematuria.  Please make an appointment with [facility] health urology [place] for a follow-up of hematuria, PSA.  Please think plenty of fluids you can come back to ED or go to your PCP if you have new symptoms or symptoms worsen

## 2023-11-03 NOTE — ED Notes (Signed)
 Pt ambulated to and from restroom, NADN

## 2023-11-03 NOTE — Telephone Encounter (Signed)
 Copied from CRM [id number]. Topic: Clinical - Red Word Triage >> Nov 03, 2023  1:43 PM Christopher Cervantes wrote: Red Word that prompted transfer to Nurse Triage: substantial swelling in scrotum   Chief Complaint: Scrotal swelling Symptoms: swelling/discoloration Frequency: started today around 10-11 am Pertinent Negatives: Patient denies injuries, abdomen pain, difficulty passing urine, fever, vomiting Disposition: [] ED /[x] Urgent Care (no appt availability in office) / [] Appointment(In office/virtual)/ []  [facility]/ [] Home Care/ [] Refused Recommended Disposition /[] [place] [facility]/ []  Follow-up with PCP Additional Notes: Patient called and advised that he noticed this morning that his scrotum and area above his  Patient states some swelling above his penis around the bladder area. Patient states that his scrotum is swollen as well.  He states that he had some eczema in the area that he put eczema cream on.  He also states that his scrotum was swollen that it started to turn dark, almost black in color. He put ice on it and that seemed to help some. The color is started to come back and it's not black in color anymore per patient after using ice. Patient denies any injuries, abdominal pain, fever, difficulty passing urine, chest pain, difficulty breathing, vomiting. Patient also denies any draining sores, leg or ankle swelling, or any history of ever being told he had a hernia. Patient is advised that it is recommended that he be seen and evaluated by a provider/PCP in the next 3 days based on having swelling with no pain and there are no immediate openings with his PCP or other providers within his office.  Patient states that there's an urgent care nearby that he will go to tomorrow if he doesn't go today. Patient is given Care Advice as per protocol and educated on the importance of being evaluated immediately if he has any pain.  Patient was very understanding. Patient is advised that is  anything changes or worsens at all to go directly to the Emergency Room.  Patient verbalized understanding.  Reason for Disposition  [1] Scrotum swelling AND [2] no pain  Answer Assessment - Initial Assessment Questions 1. SCROTAL SWELLING: "What does the scrotum look like?" "How swollen is it?" (mild, moderate severe; compare to other side)     moderate 2. LOCATION: "Where is the swelling located?"     scrotum 3. ONSET: "When did the swelling start?"     10-11 am today 4. PATTERN: "Does it come and go, or has it been constant since it started?"     constant 5. SCROTAL PAIN: "Is there any pain?" If Yes, ask: "How bad is it?"  (Scale 1-10; or mild, moderate, severe)     0 6. HERNIA: "Has a doctor ever told you that you have a hernia?"     No 7. OTHER SYMPTOMS: "Do you have any other symptoms?" (e.g., abdomen pain, difficulty passing urine, fever, vomiting)     No  Protocols used: Scrotum Swelling-A-AH

## 2023-11-03 NOTE — ED Provider Notes (Signed)
 [facility] Provider Note    Event Date/Time   First MD Initiated Contact with Patient 11/03/23 2111     (approximate)   History   Groin Swelling   HPI  Christopher Cervantes is a 68 y.o. male who presents today with history of change of the skin of his scrotum to black color but now the color came back to normal.  Patient denies any trauma, testicular pain, different high of the testicles or taking any GLP-1.  Patient is taking blood thinners.     Physical Exam   Triage Vital Signs: ED Triage Vitals  Encounter Vitals Group     BP 11/03/23 1730 127/78     Systolic BP Percentile --      Diastolic BP Percentile --      Pulse Rate 11/03/23 1730 83     Resp 11/03/23 1730 18     Temp 11/03/23 1730 98.3 F (36.8 C)     Temp Source 11/03/23 1730 Oral     SpO2 11/03/23 1730 98 %     Weight 11/03/23 1729 215 lb (97.5 kg)     Height 11/03/23 1729 6' (1.829 m)     Head Circumference --      Peak Flow --      Pain Score 11/03/23 1729 0     Pain Loc --      Pain Education --      Exclude from Growth Chart --     Most recent vital signs: Vitals:   11/03/23 1730  BP: 127/78  Pulse: 83  Resp: 18  Temp: 98.3 F (36.8 C)  SpO2: 98%     Constitutional: Alert nondistressed Eyes: Conjunctivae are normal.  Head: Atraumatic. Nose: No congestion/rhinnorhea. Mouth/Throat: Mucous membranes are moist.   Neck: Painless ROM.  Cardiovascular:   Good peripheral circulation. Respiratory: Normal respiratory effort.  No retractions.  Gastrointestinal: Soft and nontender.  Musculoskeletal:  no deformity Neurologic:  MAE spontaneously. No gross focal neurologic deficits are appreciated.  Skin:  Skin is warm, dry and intact. No rash noted. Psychiatric: Mood and affect are normal. Speech and behavior are normal. Genitalia: Skin is intact, color of the skin within normal limits, no hematomas.  Testicles no tender to palpation, no presence of masses, no presence of  hernias.  No penile discharge.    ED Results / Procedures / Treatments   Labs (all labs ordered are listed, but only abnormal results are displayed) Labs Reviewed  CBC - Abnormal; Notable for the following components:      Result Value   RBC 3.87 (*)    MCV 100.8 (*)    MCH 34.9 (*)    All other components within normal limits  BASIC METABOLIC PANEL - Abnormal; Notable for the following components:   Glucose, Bld 134 (*)    Creatinine, Ser 1.28 (*)    All other components within normal limits  URINALYSIS, ROUTINE W REFLEX MICROSCOPIC - Abnormal; Notable for the following components:   Color, Urine YELLOW (*)    APPearance CLEAR (*)    Hgb urine dipstick SMALL (*)    Leukocytes,Ua TRACE (*)    All other components within normal limits     EKG     RADIOLOGY     PROCEDURES:  Critical Care performed:   Procedures   MEDICATIONS ORDERED IN ED: Medications - No data to display   IMPRESSION / MDM / ASSESSMENT AND PLAN / ED COURSE  I reviewed the triage  vital signs and the nursing notes.  Differential diagnosis includes, but is not limited to, rule out torsion, STD, UTI, inguinal hernia,  Patient's presentation is most consistent with acute complicated illness / injury requiring diagnostic workup.   Patient's diagnosis is consistent with hematuria. Labs are  reassuring.  Physical exam patient does not present any signs of testicular torsion, gangrene, or trauma. I did review the patient's allergies and medications.The patient is in stable and satisfactory condition for discharge home  Patient will be discharged home without prescriptions  Patient is to follow up with urology due to hematuria as needed or otherwise directed. Patient is given ED precautions to return to the ED for any worsening or new symptoms. Discussed plan of care with patient, answered all of patient's questions, Patient agreeable to plan of care. Advised patient to take medications according to the  instructions on the label. Discussed possible side effects of new medications. Patient verbalized understanding. Clinical Course as of 11/03/23 2215  Wed Nov 03, 2023  2157 Basic metabolic panel(!) Electrolytes normal limits, creatinine elevated 1.2, anion gap within normal limits [AE]  2158 Urinalysis, Routine w reflex microscopic -Urine, Clean Catch(!) Hemoglobin positive, leukocytes trace [AE]  2158 CBC(!) White blood cells within normal limits [AE]    Clinical Course User Index [AE] Gladys Damme, PA-C     FINAL CLINICAL IMPRESSION(S) / ED DIAGNOSES   Final diagnoses:  None     Rx / DC Orders   ED Discharge Orders     None        Note:  This document was prepared using Dragon voice recognition software and may include unintentional dictation errors.   Gladys Damme, PA-C 11/03/23 2225    [place]ton [place], MD 11/03/23 [phone]

## 2023-11-03 NOTE — ED Triage Notes (Signed)
 Pt via POV from home. Pt states he has been having some scrotal swelling, states that his scrotum was turning "black" but now the color is coming back. Denies dysuria. Denies penile discharge. Denies pain. Pt is A&OX4 and NAD

## 2023-11-17 ENCOUNTER — Encounter: Payer: Self-pay | Admitting: Urology

## 2023-11-17 ENCOUNTER — Ambulatory Visit: Admitting: Urology

## 2023-11-17 VITALS — BP 132/82 | HR 97 | Ht 72.0 in | Wt 214.0 lb

## 2023-11-17 DIAGNOSIS — N138 Other obstructive and reflux uropathy: Secondary | ICD-10-CM

## 2023-11-17 DIAGNOSIS — N401 Enlarged prostate with lower urinary tract symptoms: Secondary | ICD-10-CM

## 2023-11-17 DIAGNOSIS — N509 Disorder of male genital organs, unspecified: Secondary | ICD-10-CM | POA: Diagnosis not present

## 2023-11-17 NOTE — Progress Notes (Signed)
 I, Maysun Anabel Bene, acting as a scribe for Riki Altes, MD., have documented all relevant documentation on the behalf of Riki Altes, MD, as directed by Riki Altes, MD while in the presence of Riki Altes, MD.  11/17/2023 12:26 PM   Christopher Cervantes 1956/05/29 [id number]  Referring provider: Gladys Damme, PA-C [address] [place],  [place] [address]  Chief Complaint  Patient presents with   Establish Care   Groin Swelling    HPI: Christopher Cervantes is a 68 y.o. male referred for swe lling of scrotum  Presented to the ED 11/03/2023 complaining of discoloration of his scrotum. Denied swelling. He states at times his scrotum is no longer apparent, however at other times the color is darker. Evaluation in the ED remarkable for a scrotal ultrasound which showed a tunica albuginea cyst and small epididymal cyst, but no significant pathology.  He was also told he had blood in his urine, however urinalysis was only dipstick positive with the small blood and microscopy was negative.  He was previously followed at [facility] urology with a previous biopsy showing high grade PIN. He was on finasteride and  was last seen at [facility] December 2020.  He was on finasteride, however has been off this medication for at least 2 years and has no bothersome lower urinary tract symptoms.    PMH: Past Medical History:  Diagnosis Date   Dental bridge present    top right   GERD (gastroesophageal reflux disease)    HLD (hyperlipidemia)    HTN (hypertension)    IBS (irritable bowel syndrome)     Surgical History: Past Surgical History:  Procedure Laterality Date   COLONOSCOPY WITH PROPOFOL N/A 09/17/2017   Procedure: COLONOSCOPY WITH PROPOFOL;  Surgeon: Midge Minium, MD;  Location: [facility];  Service: Endoscopy;  Laterality: N/A;   POLYPECTOMY  09/17/2017   Procedure: POLYPECTOMY INTESTINAL;  Surgeon: Midge Minium, MD;  Location: [facility];  Service: Endoscopy;;    TONSILLECTOMY     VASECTOMY     WISDOM TOOTH EXTRACTION      Home Medications:  Allergies as of 11/17/2023       Reactions   Lactose    "leaky gut"   Lactose Intolerance (gi)    "leaky gut"   Shellfish Allergy    Stomach issues Stomach issues   Wheat Other (See Comments)   "leaky gut"        Medication List        Accurate as of November 17, 2023 12:26 PM. If you have any questions, ask your nurse or doctor.          STOP taking these medications    QC TUMERIC COMPLEX PO Stopped by: Riki Altes       TAKE these medications    amLODipine 10 MG tablet Commonly known as: NORVASC Take 1 tablet (10 mg total) by mouth daily.   busPIRone 7.5 MG tablet Commonly known as: BUSPAR Take 1 tablet (7.5 mg total) by mouth 2 (two) times daily.   Candicidal Caps Take by mouth.   COQ10 PO Take by mouth.   esomeprazole 10 MG packet Commonly known as: NEXIUM Take 10 mg by mouth daily before breakfast.   FISH OIL PO Take by mouth.   fluticasone 50 MCG/ACT nasal spray Commonly known as: FLONASE Place 2 sprays into both nostrils daily.   FOLIC ACID PO Take by mouth.   furosemide 20 MG tablet Commonly  known as: LASIX Take 1 tablet (20 mg total) by mouth daily as needed.   GARLIQUE PO Take by mouth.   losartan 100 MG tablet Commonly known as: COZAAR Take 1 tablet (100 mg total) by mouth daily.   metoprolol succinate 25 MG 24 hr tablet Commonly known as: TOPROL-XL Take 1 tablet (25 mg total) by mouth daily.   NAPROXEN SODIUM PO Take by mouth.   OVER THE COUNTER MEDICATION Take by mouth.   pantoprazole 40 MG tablet Commonly known as: PROTONIX Take 1 tablet (40 mg total) by mouth daily.   rosuvastatin 5 MG tablet Commonly known as: CRESTOR Take 1 tablet (5 mg total) by mouth daily.   SOLUBLE FIBER/PROBIOTICS PO Take by mouth daily.   VITAMIN B-12 PO Take by mouth.   VITAMIN D3 PO Take by mouth.        Allergies:  Allergies   Allergen Reactions   Lactose     "leaky gut"   Lactose Intolerance (Gi)     "leaky gut"   Shellfish Allergy     Stomach issues Stomach issues   Wheat Other (See Comments)    "leaky gut"    Family History: Family History  Problem Relation Age of Onset   Hypertension Mother    Hyperlipidemia Mother    Cancer Father    Hyperlipidemia Sister    Hypertension Sister    Hyperlipidemia Brother    Hypertension Brother     Social History:  reports that he quit smoking about 41 years ago. His smoking use included cigarettes. He has never used smokeless tobacco. He reports current alcohol use of about 28.0 standard drinks of alcohol per week. He reports that he does not use drugs.   Physical Exam: BP 132/82   Pulse 97   Ht 6' (1.829 m)   Wt 214 lb (97.1 kg)   BMI 29.02 kg/m   Constitutional:  Alert and oriented, No acute distress. HEENT:  AT, moist mucus membranes.  Trachea midline, no masses. Cardiovascular: No clubbing, cyanosis, or edema. Respiratory: Normal respiratory effort, no increased work of breathing. GI: Abdomen is soft, nontender, nondistended, no abdominal masses GU: Phallus without lesions. Scrotal skin without thickening, slightly darker in appearance but otherwise normal. Prostate 45 grams, smooth without nodules. Skin: No rashes, bruises or suspicious lesions. Neurologic: Grossly intact, no focal deficits, moving all 4 extremities. Psychiatric: Normal mood and affect.  Assessment & Plan:    1. Scrotal Discoloration Skin slightly darker in appearance but otherwise normal; no significant pathology suspected. He does have a dermatologist that he sees regularly and would recommend he get a dermatology opinion.   2. High-Grade PIN Review of [facility] records does not indicate the date of the biopsy. PSAs since 2015 have been less than one, though the patient was on finasteride. PSA recheck today; expect it to be in the one range. Recommend annual follow-up with PSA  monitoring.  [facility] [address] [place], [place] [address] [phone]

## 2023-11-18 ENCOUNTER — Encounter: Payer: Self-pay | Admitting: Urology

## 2023-11-18 LAB — PSA: Prostate Specific Ag, Serum: 1.9 ng/mL (ref 0.0–4.0)

## 2023-12-15 ENCOUNTER — Ambulatory Visit: Admitting: Urology

## 2024-01-13 ENCOUNTER — Ambulatory Visit: Payer: Medicare HMO | Admitting: Physician Assistant

## 2024-01-16 ENCOUNTER — Other Ambulatory Visit: Payer: Self-pay | Admitting: Cardiovascular Disease

## 2024-05-30 ENCOUNTER — Encounter: Payer: Self-pay | Admitting: Urology

## 2024-06-09 DIAGNOSIS — U071 COVID-19: Secondary | ICD-10-CM | POA: Diagnosis not present

## 2024-06-09 DIAGNOSIS — R051 Acute cough: Secondary | ICD-10-CM | POA: Diagnosis not present

## 2024-06-09 DIAGNOSIS — Z03818 Encounter for observation for suspected exposure to other biological agents ruled out: Secondary | ICD-10-CM | POA: Diagnosis not present

## 2024-07-16 ENCOUNTER — Other Ambulatory Visit: Payer: Self-pay | Admitting: Cardiovascular Disease

## 2024-07-20 ENCOUNTER — Other Ambulatory Visit: Payer: Self-pay | Admitting: Cardiovascular Disease

## 2024-08-02 ENCOUNTER — Ambulatory Visit: Payer: Self-pay

## 2024-08-02 NOTE — Telephone Encounter (Signed)
 FYI Only or Action Required?: Action required by provider: requesting an xray.  Patient was last seen in primary care on 10/15/2023 by Ostwalt, Janna, PA-C.  Called Nurse Triage reporting Headache.  Symptoms began several weeks ago.  Interventions attempted: OTC medications: aspirin.  Symptoms are: unchanged.  Triage Disposition: See PCP Within 2 Weeks  Patient/caregiver understands and will follow disposition?: No, wishes to speak with PCP Reason for Disposition  Headache is a chronic symptom (recurrent or ongoing AND present > 4 weeks)  Answer Assessment - Initial Assessment Questions Taking aspirin, finding mild relief. Denies radiation, stays behind the right eye, vision not affected. Patient is looking for an xray order that he can get done before coming in for a visit. Please advise.   1. LOCATION: Where does it hurt?      Headache, right eye  2. ONSET: When did the headache start? (e.g., minutes, hours, days)      Couple weeks ago  3. PATTERN: Does the pain come and go, or has it been constant since it started?     Comes and goes  4. SEVERITY: How bad is the pain? and What does it keep you from doing?  (e.g., Scale 1-10; mild, moderate, or severe)     6/10  5. RECURRENT SYMPTOM: Have you ever had headaches before? If Yes, ask: When was the last time? and What happened that time?      Yes, had xrays, came back normal and then went away  6. CAUSE: What do you think is causing the headache?     Unsure  7. MIGRAINE: Have you been diagnosed with migraine headaches? If Yes, ask: Is this headache similar?      Denies  8. HEAD INJURY: Has there been any recent injury to your head?      Denies  9. OTHER SYMPTOMS: Do you have any other symptoms? (e.g., fever, stiff neck, eye pain, sore throat, cold symptoms)     Denies  Protocols used: [facility]  Copied from CRM #[id number]. Topic: Appointments - Appointment Scheduling >> Aug 02, 2024  8:12 AM  Emylou G wrote: Consistent headache behind right eye - worsening.SABRA

## 2024-08-02 NOTE — Telephone Encounter (Signed)
 Appt scheduled

## 2024-08-04 NOTE — Progress Notes (Signed)
 Established patient visit  Patient: Christopher Cervantes   DOB: 11-07-1955   68 y.o. Male  MRN: [id number] Visit Date: 08/07/2024  Today's healthcare provider: Jolynn Spencer, PA-C   Chief Complaint  Patient presents with   Headache    Behind R eye severe pain otc: aspirin ongoing couple weeks. Pt reports comes and goes. Has had inaging done, saw ENT.    Subjective      Discussed the use of AI scribe software for clinical note transcription with the patient, who gave verbal consent to proceed.  History of Present Illness Christopher Cervantes is a 68 year old male who presents with persistent headache behind the right eye.  He has had recurrent headaches behind the right eye for more than four weeks, rated 6 to 7 out of 10, without radiation or visual changes. Over-the-counter aspirin gives mild relief.  He has had similar right-sided headaches in the past. Prior CT brain was normal, and symptoms resolved. A CT head in 2021 for right-sided headaches lasting three to four months showed no acute intracranial abnormalities and clear sinuses.  He denies migraines, head injury, fever, stiff neck, eye pain, sore throat, cold symptoms, or vision changes including double or blurry vision. He has sinus problems with morning post-nasal drainage and has lived in [place]  for thirty years with high pollen exposure.  He has chronic alcohol abuse, a positive ANA on neurology evaluation in 2023, hypertension treated with amlodipine  and metoprolol , hyperlipidemia treated with Crestor , and uses pantoprazole  as needed for acid reflux.  He reports a deviated septum and long-standing sinus issues after a remote tooth extraction into the sinus line. He has not seen ENT or neurology recently but was evaluated by an eye doctor in the past. Denies having visual disturbances, fever, scalp tenderness, jaw claudications        08/07/2024    8:12 AM 08/07/2024    8:11 AM 10/15/2023   10:43 AM  Depression screen PHQ  2/9  Decreased Interest 0 0 0  Down, Depressed, Hopeless 0 0 0  PHQ - 2 Score 0 0 0  Altered sleeping 0  0  Tired, decreased energy 0  0  Change in appetite 0  0  Feeling bad or failure about yourself  0  0  Trouble concentrating 0  0  Moving slowly or fidgety/restless 0  0  Suicidal thoughts 0  0  PHQ-9 Score 0  0   Difficult doing work/chores Not difficult at all       Data saved with a previous flowsheet row definition      08/07/2024    8:12 AM 10/15/2023   10:44 AM  GAD 7 : Generalized Anxiety Score  Nervous, Anxious, on Edge 0 0  Control/stop worrying 0 0  Worry too much - different things 0 1  Trouble relaxing 0 0  Restless 0 0  Easily annoyed or irritable 0 0  Afraid - awful might happen 0 0  Total GAD 7 Score 0 1  Anxiety Difficulty Not difficult at all Not difficult at all    Medications: Outpatient Medications Prior to Visit  Medication Sig   amLODipine  (NORVASC ) 10 MG tablet Take 1 tablet (10 mg total) by mouth daily.   busPIRone  (BUSPAR ) 7.5 MG tablet Take 1 tablet (7.5 mg total) by mouth 2 (two) times daily.   Cholecalciferol (VITAMIN D3 PO) Take by mouth.   Coenzyme Q10 (COQ10 PO) Take by mouth.   Cyanocobalamin  (VITAMIN B-12 PO) Take by  mouth.   esomeprazole (NEXIUM) 10 MG packet Take 10 mg by mouth daily before breakfast.   fluticasone (FLONASE) 50 MCG/ACT nasal spray Place 2 sprays into both nostrils daily.   FOLIC ACID  PO Take by mouth.   furosemide  (LASIX ) 20 MG tablet TAKE 1 TABLET BY MOUTH EVERY DAY AS NEEDED   Garlic (GARLIQUE PO) Take by mouth.   losartan  (COZAAR ) 100 MG tablet Take 1 tablet (100 mg total) by mouth daily.   metoprolol  succinate (TOPROL -XL) 25 MG 24 hr tablet Take 1 tablet (25 mg total) by mouth daily.   Misc Natural Products (CANDICIDAL) CAPS Take by mouth.   NAPROXEN SODIUM PO Take by mouth.   Omega-3 Fatty Acids (FISH OIL PO) Take by mouth.   OVER THE COUNTER MEDICATION Take by mouth.   pantoprazole  (PROTONIX ) 40 MG tablet  Take 1 tablet (40 mg total) by mouth daily.   Probiotic Product (SOLUBLE FIBER/PROBIOTICS PO) Take by mouth daily.   rosuvastatin  (CRESTOR ) 5 MG tablet Take 1 tablet (5 mg total) by mouth daily.   No facility-administered medications prior to visit.    Review of Systems  All other systems reviewed and are negative.  All negative Except see HPI       Objective    BP (!) 142/77   Pulse (!) 115   Resp 14   Ht 6' (1.829 m)   Wt 215 lb 1.6 oz (97.6 kg)   SpO2 97%   BMI 29.17 kg/m     Physical Exam Vitals reviewed.  Constitutional:      General: He is not in acute distress.    Appearance: Normal appearance. He is not diaphoretic.  HENT:     Head: Normocephalic and atraumatic.  Eyes:     General: No scleral icterus.    Conjunctiva/sclera: Conjunctivae normal.  Cardiovascular:     Rate and Rhythm: Normal rate and regular rhythm.     Pulses: Normal pulses.     Heart sounds: Normal heart sounds. No murmur heard. Pulmonary:     Effort: Pulmonary effort is normal. No respiratory distress.     Breath sounds: Normal breath sounds. No wheezing or rhonchi.  Musculoskeletal:     Cervical back: Neck supple.     Right lower leg: No edema.     Left lower leg: No edema.  Lymphadenopathy:     Cervical: No cervical adenopathy.  Skin:    General: Skin is warm and dry.     Findings: No rash.  Neurological:     Mental Status: He is alert and oriented to person, place, and time. Mental status is at baseline.  Psychiatric:        Mood and Affect: Mood normal.        Behavior: Behavior normal.      No results found for any visits on 08/07/24.      Assessment & Plan Chronic right-sided headache Chronic paroxysmal hemicrania, non-intractable, with pain localized behind the right eye. Previous CT scan in 2021 showed no acute intracranial abnormalities. Differential includes sinusitis and other neurological causes. - Referred to ENT for evaluation of sinusitis and potential impact  on headache. - Referred to neurology for further evaluation of headache etiology. - Referred to ophthalmology for vision assessment. - Consider CT scan unless if symptoms persist  Chronic sinusitis Post-nasal drainage and deviated septum. Sinus problems since a tooth extraction 40 years ago. - Advised to contact his ENT for evaluation and management of chronic sinusitis.  Essential hypertension Chronic and  unstable  Per patient due to whitecoat syndrome  blood pressure readings at home range from 130s/75-80 mmHg, with a recent reading of 142/75 mmHg.  Currently on amlodipine  10 mg and metoprolol  25 mg.  Stop taking furosemide  20 mg.  Unclear if he is still taking losartan  100 Mg - Monitor blood pressure at home twice daily for two weeks. - Return for evaluation if blood pressure remains above 140/80 mmHg. - Continue current antihypertensive regimen.  Will check with Dr. Gollian if symptoms persist Will follow-up  Hyperlipidemia Chronic and previously stable  currently managed with Crestor  5 mg. - Continue Crestor  5 mg as prescribed. Continue low-cholesterol diet and regular exercise Will follow-up  Gastroesophageal reflux disease (GERD) Chronic and stable  GERD managed with pantoprazole  as needed. - Continue pantoprazole  as needed for GERD symptoms. - Avoid dietary triggers that exacerbate reflux. Continue lifestyle modifications Will follow-up  Other headache syndrome (Primary)  - Ambulatory referral to Neurology  Pain of right eye  - Ambulatory referral to Ophthalmology  Lab work was advised.  Patient declined follow-up Will recheck at his ne follow-up xt follow-up Orders Placed This Encounter  Procedures   Ambulatory referral to Neurology    Referral Priority:   Routine    Referral Type:   Consultation    Referral Reason:   Specialty Services Required    Requested Specialty:   Neurology    Number of Visits Requested:   1   Ambulatory referral to Ophthalmology     Referral Priority:   Urgent    Referral Type:   Consultation    Referral Reason:   Specialty Services Required    Requested Specialty:   Ophthalmology    Number of Visits Requested:   1    Return in about 11 weeks (around 10/23/2024) for chronic disease f/u, CPE.   The patient was advised to call back or seek an in-person evaluation if the symptoms worsen or if the condition fails to improve as anticipated.  I discussed the assessment and treatment plan with the patient. The patient was provided an opportunity to ask questions and all were answered. The patient agreed with the plan and demonstrated an understanding of the instructions.  I, Jeannine Pennisi, PA-C have reviewed all documentation for this visit. The documentation on 08/07/2024  for the exam, diagnosis, procedures, and orders are all accurate and complete.  Jolynn Spencer, [facility], MMS [facility] [phone] (phone) [phone] (fax)  [facility]

## 2024-08-07 ENCOUNTER — Encounter: Payer: Self-pay | Admitting: Physician Assistant

## 2024-08-07 ENCOUNTER — Ambulatory Visit: Admitting: Physician Assistant

## 2024-08-07 VITALS — BP 142/77 | HR 115 | Resp 14 | Ht 72.0 in | Wt 215.1 lb

## 2024-08-07 DIAGNOSIS — I10 Essential (primary) hypertension: Secondary | ICD-10-CM | POA: Diagnosis not present

## 2024-08-07 DIAGNOSIS — H5711 Ocular pain, right eye: Secondary | ICD-10-CM | POA: Diagnosis not present

## 2024-08-07 DIAGNOSIS — G4489 Other headache syndrome: Secondary | ICD-10-CM

## 2024-08-07 DIAGNOSIS — E782 Mixed hyperlipidemia: Secondary | ICD-10-CM

## 2024-08-07 DIAGNOSIS — K219 Gastro-esophageal reflux disease without esophagitis: Secondary | ICD-10-CM

## 2024-08-07 DIAGNOSIS — J328 Other chronic sinusitis: Secondary | ICD-10-CM | POA: Diagnosis not present

## 2024-08-09 DIAGNOSIS — H2513 Age-related nuclear cataract, bilateral: Secondary | ICD-10-CM | POA: Diagnosis not present

## 2024-08-09 DIAGNOSIS — J310 Chronic rhinitis: Secondary | ICD-10-CM | POA: Diagnosis not present

## 2024-08-09 DIAGNOSIS — H5711 Ocular pain, right eye: Secondary | ICD-10-CM | POA: Diagnosis not present

## 2024-08-09 DIAGNOSIS — R519 Headache, unspecified: Secondary | ICD-10-CM | POA: Diagnosis not present

## 2024-08-28 ENCOUNTER — Other Ambulatory Visit: Payer: Self-pay | Admitting: Cardiovascular Disease

## 2024-10-10 ENCOUNTER — Other Ambulatory Visit: Payer: Self-pay | Admitting: Cardiovascular Disease

## 2024-10-12 NOTE — Telephone Encounter (Signed)
 scheduled

## 2024-10-12 NOTE — Telephone Encounter (Signed)
 Please contact pt for future appointment. Pt due for 12 month f/u.

## 2024-10-13 ENCOUNTER — Other Ambulatory Visit: Payer: Self-pay | Admitting: Cardiovascular Disease

## 2024-10-20 ENCOUNTER — Ambulatory Visit: Admitting: Diagnostic Neuroimaging

## 2024-10-24 ENCOUNTER — Ambulatory Visit: Payer: Medicare HMO

## 2024-10-24 ENCOUNTER — Encounter: Payer: Medicare HMO | Admitting: Physician Assistant

## 2024-11-09 ENCOUNTER — Other Ambulatory Visit

## 2024-11-13 ENCOUNTER — Ambulatory Visit: Admitting: Cardiovascular Disease

## 2024-11-15 ENCOUNTER — Ambulatory Visit: Admitting: Urology

## 2024-11-16 ENCOUNTER — Ambulatory Visit: Admitting: Urology
